# Patient Record
Sex: Female | Born: 1950 | Race: White | Hispanic: No | Marital: Married | Smoking: Never smoker
Health system: Southern US, Community
[De-identification: ages and names within clinical notes are randomized; demographics above are authoritative.]

## PROBLEM LIST (undated history)

## (undated) DIAGNOSIS — G20 Parkinson's disease: Secondary | ICD-10-CM

## (undated) DIAGNOSIS — K449 Diaphragmatic hernia without obstruction or gangrene: Secondary | ICD-10-CM

## (undated) DIAGNOSIS — K219 Gastro-esophageal reflux disease without esophagitis: Secondary | ICD-10-CM

## (undated) DIAGNOSIS — G20A1 Parkinson's disease without dyskinesia, without mention of fluctuations: Secondary | ICD-10-CM

## (undated) DIAGNOSIS — I499 Cardiac arrhythmia, unspecified: Secondary | ICD-10-CM

## (undated) DIAGNOSIS — E039 Hypothyroidism, unspecified: Secondary | ICD-10-CM

## (undated) DIAGNOSIS — I10 Essential (primary) hypertension: Secondary | ICD-10-CM

## (undated) HISTORY — PX: INFERIOR OBLIQUE MYECTOMY: SHX1814

---

## 1981-04-06 HISTORY — PX: VAGINAL HYSTERECTOMY: SUR661

## 1998-08-27 ENCOUNTER — Ambulatory Visit (HOSPITAL_COMMUNITY): Admission: RE | Admit: 1998-08-27 | Discharge: 1998-08-27 | Payer: Self-pay | Admitting: Family Medicine

## 2002-04-06 HISTORY — PX: OTHER SURGICAL HISTORY: SHX169

## 2003-04-11 ENCOUNTER — Other Ambulatory Visit: Admission: RE | Admit: 2003-04-11 | Discharge: 2003-04-11 | Payer: Self-pay | Admitting: Family Medicine

## 2004-05-21 ENCOUNTER — Encounter: Admission: RE | Admit: 2004-05-21 | Discharge: 2004-05-21 | Payer: Self-pay | Admitting: Gastroenterology

## 2004-06-09 ENCOUNTER — Encounter: Admission: RE | Admit: 2004-06-09 | Discharge: 2004-06-09 | Payer: Self-pay | Admitting: Gastroenterology

## 2004-06-24 ENCOUNTER — Encounter (INDEPENDENT_AMBULATORY_CARE_PROVIDER_SITE_OTHER): Payer: Self-pay | Admitting: *Deleted

## 2004-06-24 ENCOUNTER — Encounter: Admission: RE | Admit: 2004-06-24 | Discharge: 2004-06-24 | Payer: Self-pay | Admitting: Endocrinology

## 2004-06-24 ENCOUNTER — Other Ambulatory Visit: Admission: RE | Admit: 2004-06-24 | Discharge: 2004-06-24 | Payer: Self-pay | Admitting: Interventional Radiology

## 2004-08-20 ENCOUNTER — Encounter (INDEPENDENT_AMBULATORY_CARE_PROVIDER_SITE_OTHER): Payer: Self-pay | Admitting: Specialist

## 2004-08-20 ENCOUNTER — Ambulatory Visit (HOSPITAL_COMMUNITY): Admission: RE | Admit: 2004-08-20 | Discharge: 2004-08-21 | Payer: Self-pay | Admitting: General Surgery

## 2005-04-06 HISTORY — PX: TOTAL THYROIDECTOMY: SHX2547

## 2006-04-06 HISTORY — PX: INFERIOR OBLIQUE MYECTOMY: SHX1814

## 2007-07-18 ENCOUNTER — Encounter: Admission: RE | Admit: 2007-07-18 | Discharge: 2007-07-18 | Payer: Self-pay | Admitting: Interventional Cardiology

## 2007-07-21 ENCOUNTER — Inpatient Hospital Stay (HOSPITAL_BASED_OUTPATIENT_CLINIC_OR_DEPARTMENT_OTHER): Admission: RE | Admit: 2007-07-21 | Discharge: 2007-07-21 | Payer: Self-pay | Admitting: Interventional Cardiology

## 2009-01-09 ENCOUNTER — Ambulatory Visit: Payer: Self-pay | Admitting: Family Medicine

## 2009-01-09 DIAGNOSIS — I10 Essential (primary) hypertension: Secondary | ICD-10-CM | POA: Insufficient documentation

## 2009-01-09 DIAGNOSIS — L910 Hypertrophic scar: Secondary | ICD-10-CM

## 2009-01-09 DIAGNOSIS — E785 Hyperlipidemia, unspecified: Secondary | ICD-10-CM

## 2009-01-09 DIAGNOSIS — E039 Hypothyroidism, unspecified: Secondary | ICD-10-CM | POA: Insufficient documentation

## 2009-01-09 DIAGNOSIS — G20 Parkinson's disease: Secondary | ICD-10-CM

## 2009-01-10 LAB — CONVERTED CEMR LAB: TSH: 2.171 microintl units/mL (ref 0.350–4.500)

## 2009-01-18 ENCOUNTER — Ambulatory Visit: Payer: Self-pay | Admitting: Family Medicine

## 2009-03-14 ENCOUNTER — Encounter: Payer: Self-pay | Admitting: Family Medicine

## 2009-04-03 ENCOUNTER — Telehealth (INDEPENDENT_AMBULATORY_CARE_PROVIDER_SITE_OTHER): Payer: Self-pay | Admitting: *Deleted

## 2009-04-11 ENCOUNTER — Ambulatory Visit: Payer: Self-pay | Admitting: Family Medicine

## 2009-04-11 DIAGNOSIS — M2550 Pain in unspecified joint: Secondary | ICD-10-CM | POA: Insufficient documentation

## 2009-04-18 ENCOUNTER — Encounter: Payer: Self-pay | Admitting: Family Medicine

## 2009-04-19 LAB — CONVERTED CEMR LAB
HCT: 37.7 % (ref 36.0–46.0)
Hemoglobin: 12.4 g/dL (ref 12.0–15.0)
Iron: 85 ug/dL (ref 42–145)
Saturation Ratios: 19 % — ABNORMAL LOW (ref 20–55)
TIBC: 442 ug/dL (ref 250–470)
Vitamin B-12: 274 pg/mL (ref 211–911)
WBC: 7.1 10*3/uL (ref 4.0–10.5)

## 2009-04-25 ENCOUNTER — Encounter: Payer: Self-pay | Admitting: Family Medicine

## 2009-07-15 ENCOUNTER — Encounter: Admission: RE | Admit: 2009-07-15 | Discharge: 2009-07-15 | Payer: Self-pay | Admitting: Family Medicine

## 2009-08-21 ENCOUNTER — Ambulatory Visit: Payer: Self-pay | Admitting: Family Medicine

## 2009-08-21 DIAGNOSIS — E669 Obesity, unspecified: Secondary | ICD-10-CM

## 2009-10-02 ENCOUNTER — Encounter: Payer: Self-pay | Admitting: Family Medicine

## 2009-12-25 ENCOUNTER — Encounter: Payer: Self-pay | Admitting: Family Medicine

## 2009-12-27 ENCOUNTER — Ambulatory Visit: Payer: Self-pay | Admitting: Family Medicine

## 2010-03-25 ENCOUNTER — Ambulatory Visit: Payer: Self-pay | Admitting: Family Medicine

## 2010-03-25 DIAGNOSIS — M25519 Pain in unspecified shoulder: Secondary | ICD-10-CM

## 2010-04-06 HISTORY — PX: OTHER SURGICAL HISTORY: SHX169

## 2010-04-24 ENCOUNTER — Encounter: Payer: Self-pay | Admitting: Family Medicine

## 2010-05-08 NOTE — Consult Note (Signed)
Summary: [facility]  [facility]   Imported By: Lanelle Bal 10/17/2009 08:55:57  _____________________________________________________________________  External Attachment:    Type:   Image     Comment:   External Document

## 2010-05-08 NOTE — Assessment & Plan Note (Signed)
Summary: THYROID LEVEL IS HIGH/   Vital Signs:  Patient profile:   60 year old female Height:      63 inches Weight:      222 pounds Pulse rate:   104 / minute BP sitting:   114 / 69  (right arm) Cuff size:   regular  Vitals Entered By: Avon Gully CMA, Duncan Dull) (December 27, 2009 4:00 PM) CC: f/u thyroid   Primary Care Faiza Bansal:  Nani Gasser MD  CC:  f/u thyroid.  History of Present Illness: Had abnormal TSH at work. Had noticed more brittle hair. VOice has been raspy. has gained weight as well.  She has gained 6 lb since her last OV.   Current Medications (verified): 1)  Atenolol 25 Mg Tabs (Atenolol) .Marland Kitchen.. 1 Tab By Mouth Daily 2)  Levothyroxine Sodium 112 Mcg Tabs (Levothyroxine Sodium) .... Take 1 Tab By Mouth Once Daily 3)  Ropinirole Hcl 3 Mg Tabs (Ropinirole Hcl) .... Take 1 Tab By Mouth Three Times A Day 4)  Gemfibrozil 600 Mg Tabs (Gemfibrozil) .... Take 1 Tab By Mouth Two Times A Day 5)  Azilect 1 Mg Tabs (Rasagiline Mesylate) .... Take 1 Tab By Mouth Once Daily 6)  Pantoprazole Sodium 40 Mg Tbec (Pantoprazole Sodium) .... Take 1 Tab By Mouth Once Daily 7)  Lisinopril-Hydrochlorothiazide 20-12.5 Mg Tabs (Lisinopril-Hydrochlorothiazide) .... Take 1 Tab By Mouth Once Daily 8)  Magnesium 250mg  .... Take By Mouth Once Daily 9)  Asa 81mg  .... Take 1 By Mouth Daily 10)  Fish Oil 1000 Mg Caps (Omega-3 Fatty Acids) .... 3 Tabs By Mouth Daily .  Allergies (verified): No Known Drug Allergies  Comments:  Nurse/Medical Assistant: The patient's medications and allergies were reviewed with the patient and were updated in the Medication and Allergy Lists. Avon Gully CMA, Duncan Dull) (December 27, 2009 4:01 PM)  Past History:  Past Surgical History: Hysterectomy 1983 Thryoid surgery 08/2006 Myectomy because of a valve that wasn't closing - [facility] - Dr. Verdis Prime ([place])  Physical Exam  General:  Well-developed,well-nourished,in no acute distress;  alert,appropriate and cooperative throughout examination Head:  Normocephalic and atraumatic without obvious abnormalities. No apparent alopecia or balding. Neck:  No deformities, masses, or tenderness noted. No palpable thyroid. Scar well healed.  Lungs:  Normal respiratory effort, chest expands symmetrically. Lungs are clear to auscultation, no crackles or wheezes. Heart:  Normal rate and regular rhythm. S1 and S2 normal without gallop, murmur, click, rub or other extra sounds. Skin:  Hair appears brittle.  Psych:  Cognition and judgment appear intact. Alert and cooperative with normal attention span and concentration. No apparent delusions, illusions, hallucinations   Impression & Recommendations:  Problem # 1:  HYPOTHYROIDISM (ICD-244.9) Over due to recheck levels esp since she is symptomatic.  Her updated medication list for this problem includes:    Levothyroxine Sodium 125 Mcg Tabs (Levothyroxine sodium) .Marland Kitchen... Take 1 tablet by mouth once a day in the am.  Labs Reviewed: TSH: 2.171 (01/09/2009)     Complete Medication List: 1)  Atenolol 25 Mg Tabs (Atenolol) .Marland Kitchen.. 1 tab by mouth daily 2)  Levothyroxine Sodium 125 Mcg Tabs (Levothyroxine sodium) .... Take 1 tablet by mouth once a day in the am. 3)  Ropinirole Hcl 3 Mg Tabs (ropinirole Hcl)  .... Take 1 tab by mouth three times a day 4)  Gemfibrozil 600 Mg Tabs (Gemfibrozil) .... Take 1 tab by mouth two times a day 5)  Azilect 1 Mg Tabs (Rasagiline mesylate) .... Take  1 tab by mouth once daily 6)  Pantoprazole Sodium 40 Mg Tbec (Pantoprazole sodium) .... Take 1 tab by mouth once daily 7)  Lisinopril-hydrochlorothiazide 20-12.5 Mg Tabs (Lisinopril-hydrochlorothiazide) .... Take 1 tab by mouth once daily 8)  Magnesium 250mg   .... Take by mouth once daily 9)  Asa 81mg   .... Take 1 by mouth daily 10)  Fish Oil 1000 Mg Caps (Omega-3 fatty acids) .... 3 tabs by mouth daily .  Patient Instructions: 1)  Recheck thyroid TSH in 6 weeks.  2)   Encourage weight loss, exercise, and healthy diet.  Prescriptions: PANTOPRAZOLE SODIUM 40 MG TBEC (PANTOPRAZOLE SODIUM) Take 1 tab by mouth once daily  #90 x 3   Entered and Authorized by:   Nani Gasser MD   Signed by:   Nani Gasser MD on 12/27/2009   Method used:   Electronically to        [facility] (retail)       [address] [place], [place]  [address]       Ph: [phone]       Fax: [phone]   RxID:   [id number] ATENOLOL 25 MG TABS (ATENOLOL) 1 tab by mouth daily  #90 x 1   Entered and Authorized by:   Nani Gasser MD   Signed by:   Nani Gasser MD on 12/27/2009   Method used:   Electronically to        [facility] (retail)       [address] [place], [place]  [address]       Ph: [phone]       Fax: [phone]   RxID:   [id number] LEVOTHYROXINE SODIUM 125 MCG TABS (LEVOTHYROXINE SODIUM) Take 1 tablet by mouth once a day in the AM.  #30 x 0   Entered and Authorized by:   Nani Gasser MD   Signed by:   Nani Gasser MD on 12/27/2009   Method used:   Electronically to        [facility] (retail)       [address] [place], [place]  [address]       Ph: [phone]       Fax: [phone]   RxID:   [id number]   Appended Document: THYROID LEVEL IS HIGH/ Pt also brough in hcopy of her labwork done at work.  Her Chold and TG were high. She says this is dramatically better than it used to be. She is gemfibrozil. Asked her to ahve work check a direct LDL for her. Alos adjust her thyroid meds and told her to have them rechecked through work and send me the report.  September 25, 20111:30 PM Metheney MD, Santina Evans   Lipid Panel Test Date: 12/25/2009                        Value        Units        H/L   Reference  Cholesterol:          226          mg/dL              (517-616) HDL Cholesterol:      39           mg/dL              (  30-80) Triglyceride:         433           mg/dL         H    (16-109)

## 2010-05-08 NOTE — Assessment & Plan Note (Signed)
Summary: Shoulder Pain, Left   Vital Signs:  Patient profile:   60 year old female Height:      63 inches Weight:      228 pounds BMI:     40.53 O2 Sat:      97 % on Room air Temp:     98.2 degrees F oral Pulse rate:   79 / minute BP sitting:   126 / 59  (left arm) Cuff size:   regular  Vitals Entered By: Payton Spark CMA (March 25, 2010 3:52 PM)  O2 Flow:  Room air CC: L arm pain x 1.5 weeks. No known injury.   Primary Care Provider:  Nani Gasser MD  CC:  L arm pain x 1.5 weeks. No known injury.Marland Kitchen  History of Present Illness: L arm pain x 1.5 weeks. No known injury. Pain from her outer sholder to mid-upper arm. Worse if abducts her shoulder to 90 degrees. .  Painful to sleep on that side. BOthers her more at night.  No alleviating sxs. Using [place]. Took Aleve a couple of times - not sure helps.  she says she takes Aleve once a day in the morning every day for her general joint pain.  She does note that her left shorter pain is worse in the evening so it is possible that the Aleve in the morning is wearing off and  the dose is actually helping her pain.  Current Medications (verified): 1)  Atenolol 25 Mg Tabs (Atenolol) .Marland Kitchen.. 1 Tab By Mouth Daily 2)  Levothyroxine Sodium 125 Mcg Tabs (Levothyroxine Sodium) .... Take 1 Tablet By Mouth Once A Day in The Am. 3)  Ropinirole Hcl 3 Mg Tabs (Ropinirole Hcl) .... Take 1 Tab By Mouth Three Times A Day 4)  Gemfibrozil 600 Mg Tabs (Gemfibrozil) .... Take 1 Tab By Mouth Two Times A Day 5)  Azilect 1 Mg Tabs (Rasagiline Mesylate) .... Take 1 Tab By Mouth Once Daily 6)  Pantoprazole Sodium 40 Mg Tbec (Pantoprazole Sodium) .... Take 1 Tab By Mouth Once Daily 7)  Lisinopril-Hydrochlorothiazide 20-12.5 Mg Tabs (Lisinopril-Hydrochlorothiazide) .... Take 1 Tab By Mouth Once Daily 8)  Magnesium 250mg  .... Take By Mouth Once Daily 9)  Asa 81mg  .... Take 1 By Mouth Daily 10)  Fish Oil 1000 Mg Caps (Omega-3 Fatty Acids) .... 3 Tabs By Mouth  Daily .  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  Married to [employer] with 4 kids.  Never Smoked Alcohol use-yes Drug use-no Regular exercise-no  Physical Exam  General:  Well-developed,well-nourished,in no acute distress; alert,appropriate and cooperative throughout examination Msk:  Right shoulder with extension to about 90 degress . ABle to reach behind her back.  Painful with crossover test. Tender over the upper deltoid and along the edge of the acromium.  + empty can test on the right.     Impression & Recommendations:  Problem # 1:  SHOULDER PAIN (ICD-719.41)  I really think she has burisit based on her history but she did have a + empty can test.  Recommend trial fo steori injection.  if this significantly improves her symptoms she likely has bursitis.  If not then will refer to physical therapy for treatment of her trigger cuff syndrome.  Would also get an x-ray at that time if she has not improved. No xrays today as not trauma etc. If not improving then consider  xray of the left shoulder.  I recommend icing her shoulder.  I also recommend trial of Aleve twice a day for the next 4 to 5 days.  She's to stop this immediately if she has any stomach or GI irritation.  Orders: Joint Aspirate / Injection, Large ([address])  Complete Medication List: 1)  Atenolol 25 Mg Tabs (Atenolol) .Marland Kitchen.. 1 tab by mouth daily 2)  Levothyroxine Sodium 125 Mcg Tabs (Levothyroxine sodium) .... Take 1 tablet by mouth once a day in the am. 3)  Ropinirole Hcl 3 Mg Tabs (ropinirole Hcl)  .... Take 1 tab by mouth three times a day 4)  Gemfibrozil 600 Mg Tabs (Gemfibrozil) .... Take 1 tab by mouth two times a day 5)  Azilect 1 Mg Tabs (Rasagiline mesylate) .... Take 1 tab by mouth once daily 6)  Pantoprazole Sodium 40 Mg Tbec (Pantoprazole sodium) .... Take 1 tab by  mouth once daily 7)  Lisinopril-hydrochlorothiazide 20-12.5 Mg Tabs (Lisinopril-hydrochlorothiazide) .... Take 1 tab by mouth once daily 8)  Magnesium 250mg   .... Take by mouth once daily 9)  Asa 81mg   .... Take 1 by mouth daily 10)  Fish Oil 1000 Mg Caps (Omega-3 fatty acids) .... 3 tabs by mouth daily . Prescriptions: LISINOPRIL-HYDROCHLOROTHIAZIDE 20-12.5 MG TABS (LISINOPRIL-HYDROCHLOROTHIAZIDE) Take 1 tab by mouth once daily  #90 x 1   Entered and Authorized by:   Nani Gasser MD   Signed by:   Nani Gasser MD on 03/25/2010   Method used:   Electronically to        [facility] (retail)       [address] [place], [place]  [address]       Ph: [phone]       Fax: [phone]   RxID:   [id number] PANTOPRAZOLE SODIUM 40 MG TBEC (PANTOPRAZOLE SODIUM) Take 1 tab by mouth once daily  #90 x 1   Entered and Authorized by:   Nani Gasser MD   Signed by:   Nani Gasser MD on 03/25/2010   Method used:   Electronically to        [facility] ([facility])       [address] [place], [place]  [address]       Ph: [phone]       Fax: [phone]   RxID:   [id number] GEMFIBROZIL 600 MG TABS (GEMFIBROZIL) Take 1 tab by mouth two times a day  #90 x 1   Entered and Authorized by:   Nani Gasser MD   Signed by:   Nani Gasser MD on 03/25/2010   Method used:   Electronically to        [facility] (retail)       [address] [place], [place]  [address]       Ph: [phone]       Fax: [phone]   RxID:   [id number] LEVOTHYROXINE SODIUM 125 MCG TABS (LEVOTHYROXINE SODIUM) Take 1 tablet by mouth once a day in the AM.  #90 x 0   Entered and Authorized by:   Nani Gasser MD   Signed by:   Nani Gasser MD on 03/25/2010   Method used:   Electronically to        [facility] (retail)       [address]  [place], [place]  [address]       Ph: [phone]       Fax: [phone]    RxID:   [id number] ATENOLOL 25 MG TABS (ATENOLOL) 1 tab by mouth daily  #90 x 1   Entered and Authorized by:   Nani Gasser MD   Signed by:   Nani Gasser MD on 03/25/2010   Method used:   Electronically to        [facility] ([facility]ail)       [address] [place], [place]  [address]       Ph: [phone]       Fax: [phone]   RxID:   [id number]    Orders Added: 1)  Est. Patient Level IV [14431] 2)  Joint Aspirate / Injection, Large [206[address]]    Procedure Note  Injections: Indication: acute pain  Procedure # 1: joint injection    Region: left shoulder    Location: posterior approach    Technique: 20 g 1-1/2' needle    Medication: 40 mg depomedrol    Anesthesia: 1% lidocaine w/o epinephrine  Cleaned and prepped with: alcohol and betadine Wound dressing: bandaid Instructions: ice

## 2010-05-08 NOTE — Assessment & Plan Note (Signed)
Summary: FU Keloid, dry skin,  & Shingles Vax   Vital Signs:  Patient profile:   60 year old female Height:      63 inches Weight:      212 pounds Pulse rate:   43 / minute BP sitting:   112 / 58  (left arm) Cuff size:   regular  Vitals Entered By: Kathlene November (April 11, 2009 3:57 PM) CC: recheck right ear   Primary Care Provider:  Nani Gasser MD  CC:  recheck right ear.  History of Present Illness: recheck right ear.  Feels the keoid has shrunk a little and would like a second injection. She also needs refills on some of her medications.  Has had aching in the legs and more thin hair. Deines acutal hair loss.  Says her skin is more dry but this is common withe the winter time for her.   Current Medications (verified): 1)  Atenolol 100 Mg Tabs (Atenolol) .... Take 1 Tab By Mouth Once Daily 2)  Levothyroxine Sodium 112 Mcg Tabs (Levothyroxine Sodium) .... Take 1 Tab By Mouth Once Daily 3)  Ropinirole Hcl 3 Mg Tabs (Ropinirole Hcl) .... Take 1 Tab By Mouth Three Times A Day 4)  Gemfibrozil 600 Mg Tabs (Gemfibrozil) .... Take 1 Tab By Mouth Two Times A Day 5)  Azilect 1 Mg Tabs (Rasagiline Mesylate) .... Take 1 Tab By Mouth Once Daily 6)  Pantoprazole Sodium 40 Mg Tbec (Pantoprazole Sodium) .... Take 1 Tab By Mouth Once Daily 7)  Lisinopril-Hydrochlorothiazide 20-12.5 Mg Tabs (Lisinopril-Hydrochlorothiazide) .... Take 1 Tab By Mouth Once Daily 8)  Magnesium 250mg  .... Take By Mouth Once Daily 9)  Asa 81mg  .... Take 1 By Mouth Daily 10)  Fish Oil 1000 Mg Caps (Omega-3 Fatty Acids) .... 3 Tabs By Mouth Daily .  Allergies (verified): No Known Drug Allergies  Comments:  Nurse/Medical Assistant: The patient's medications and allergies were reviewed with the patient and were updated in the Medication and Allergy Lists. Kathlene November (April 11, 2009 3:59 PM)  Physical Exam  General:  Well-developed,well-nourished,in no acute distress; alert,appropriate and cooperative  throughout examination Head:  Normocephalic and atraumatic without obvious abnormalities. No apparent alopecia or balding. Skin:  Right ear with approx 1 cm keloid.    Impression & Recommendations:  Problem # 1:  ESSENTIAL HYPERTENSION, BENIGN (ICD-401.1) Looks great today. REfilled medications.  Her updated medication list for this problem includes:    Atenolol 100 Mg Tabs (Atenolol) .Marland Kitchen... Take 1 tab by mouth once daily    Lisinopril-hydrochlorothiazide 20-12.5 Mg Tabs (Lisinopril-hydrochlorothiazide) .Marland Kitchen... 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 Use the freeze spary for comfort.  0.5 cc of 40mg  kenalog and 0.5cc of lidocaine no epi mixed 1:1 and 0.75 cc injected into the keloid lesion after the area was cleaned with alcohol swab.   Orders: Kenalog 10mg  (up to 2 units) (J3301)  Problem # 3:  DRY SKIN (ICD-701.1) Will rule out anemia and deficiencies that might be causing her hair and skin changes. We recently checked her thyroid and it was well controlled.   Complete Medication List: 1)  Atenolol 100 Mg Tabs (Atenolol) .... Take 1 tab by mouth once daily 2)  Levothyroxine Sodium 112 Mcg Tabs (Levothyroxine sodium) .... Take 1 tab by mouth once daily 3)  Ropinirole Hcl 3 Mg Tabs (ropinirole Hcl)  .... Take 1 tab by mouth three times a day  4)  Gemfibrozil 600 Mg Tabs (Gemfibrozil) .... Take 1 tab by mouth two times a day 5)  Azilect 1 Mg Tabs (Rasagiline mesylate) .... Take 1 tab by mouth once daily 6)  Pantoprazole Sodium 40 Mg Tbec (Pantoprazole sodium) .... Take 1 tab by mouth once daily 7)  Lisinopril-hydrochlorothiazide 20-12.5 Mg Tabs (Lisinopril-hydrochlorothiazide) .... Take 1 tab by mouth once daily 8)  Magnesium 250mg   .... Take by mouth once daily 9)  Asa 81mg   .... Take 1 by mouth daily 10)  Fish Oil 1000 Mg Caps (Omega-3 fatty acids) .... 3 tabs by mouth daily .  Other Orders: Zoster (Shingles) Vaccine Live  [id number]) Admin 1st Vaccine ([address]454) T-CBC No Diff [id number]) T-Vitamin B12 [phone]) T-Vitamin D (25-Hydroxy) [phone]) T-Iron [phone]) T-Iron Binding Capacity (TIBC) ([address]) T-Folate ([address]347) T-Comprehensive Metabolic Panel ([address]75) Prescriptions: LISINOPRIL-HYDROCHLOROTHIAZIDE 20-12.5 MG TABS (LISINOPRIL-HYDROCHLOROTHIAZIDE) Take 1 tab by mouth once daily  #90 x 3   Entered and Authorized by:   Nani Gasser MD   Signed by:   Nani Gasser MD on 04/11/2009   Method used:   Electronically to        [facility] [address] [phone]* (retail)       [address]       [place], [place]  [address]       Ph: [phone] or [phone]       Fax: [phone]   RxID:   [id number] ATENOLOL 100 MG TABS (ATENOLOL) Take 1 tab by mouth once daily  #90 x 3   Entered and Authorized by:   Nani Gasser MD   Signed by:   Nani Gasser MD on 04/11/2009   Method used:   [facility]ronically to        [facility]  [address] [phone]* (retail)       [address]       [place], [place]  [address]       Ph: [phone] or [phone]       Fax: [phone]   RxID:   [id number] LEVOTHYROXINE SODIUM 112 MCG TABS (LEVOTHYROXINE SODIUM) Take 1 tab by mouth once daily  #90 x 1   Entered and Authorized by:   Nani Gasser MD   Signed by:   Nani Gasser MD on 04/11/2009   Method used:   [facility]        [facility]  [address] [phone]* (retail)       [address]       [place], [place]  [address]       Ph: [phone] or [phone]       Fax: [phone]   RxID:   [id number] PANTOPRAZOLE SODIUM 40 MG TBEC (PANTOPRAZOLE SODIUM) Take 1 tab by mouth once daily  #90 x 3   Entered and Authorized by:   Nani Gasser MD   Signed by:   Nani Gasser MD on 04/11/2009   Method used:   [facility]        [facility]  [address] [phone]* (retail)       [address]       [place], [place]  [address]       Ph: [phone] or [phone]        Fax: [phone]   RxID:   [id number] GEMFIBROZIL 600 MG TABS (GEMFIBROZIL) Take 1 tab by mouth two times a day  #90 x 3   Entered and Authorized by:   Nani Gasser MD   Signed by:   Nani Gasser  MD on 04/11/2009   Method used:   [facility] [phone]* (retail)       [address]       [place], [place]  [address]       Ph: [phone] or [phone]       Fax: [phone]   RxID:   [id number]    Immunizations Administered:  Zostavax # 1:    Vaccine Type: Zostavax    Site: right deltoid    Mfr: Merck    Dose: 0.75ml    Route: IM    Given by: Kathlene November    Exp. Date: 05/03/2010    Lot #: [id number]    VIS given: 01/16/05 given April 11, 2009.   Procedure Note  Injections:    Comment: Lidocaine [id number]  Exp-10/2011 kenalog  [id number]  Exp.06/2010

## 2010-05-08 NOTE — Letter (Signed)
Summary: [facility]  [facility]   Imported By: Lanelle Bal 05/10/2009 09:11:17  _____________________________________________________________________  External Attachment:    Type:   Image     Comment:   External Document

## 2010-05-08 NOTE — Assessment & Plan Note (Signed)
Summary: f/u keloid/ hoarseness/ wt   Vital Signs:  Patient profile:   60 year old female Height:      63 inches Weight:      216 pounds BMI:     38.40 O2 Sat:      97 % on Room air Pulse rate:   55 / minute BP sitting:   95 / 57  (left arm) Cuff size:   regular  Vitals Entered By: Payton Spark CMA (Aug 21, 2009 4:01 PM)  O2 Flow:  Room air CC: Would like keloid removed from R ear. Also would like to discuss weight loss.   Primary Care Provider:  Nani Gasser MD  CC:  Would like keloid removed from R ear. Also would like to discuss weight loss.Marland Kitchen  History of Present Illness: 60 yo WF presents for a keloid behind her R ear.  It has been there for several months after changing earing.  Had hx of keloid in the past, needed plastic surgery.  Dr Linford Arnold has tried intralesional triamcinolone twice and it shrunk some but is still dangling under the R ear lobe and bothters her.  Not painful.    She is concerned about wt gain and voice hoarseness.  Had a total thyroidectomy for noncancerous reasons w/ o neck radiation years ago.  Denies acid reflux symptoms or sore throat.  has dry mouth.  Admits to lack of exercise but isn't sure why she is gaining wt w/o changing her diet.  TSH has been normal.  Current Medications (verified): 1)  Atenolol 100 Mg Tabs (Atenolol) .... Take 1/2 Tab By Mouth Once Daily 2)  Levothyroxine Sodium 112 Mcg Tabs (Levothyroxine Sodium) .... Take 1 Tab By Mouth Once Daily 3)  Ropinirole Hcl 3 Mg Tabs (Ropinirole Hcl) .... Take 1 Tab By Mouth Three Times A Day 4)  Gemfibrozil 600 Mg Tabs (Gemfibrozil) .... Take 1 Tab By Mouth Two Times A Day 5)  Azilect 1 Mg Tabs (Rasagiline Mesylate) .... Take 1 Tab By Mouth Once Daily 6)  Pantoprazole Sodium 40 Mg Tbec (Pantoprazole Sodium) .... Take 1 Tab By Mouth Once Daily 7)  Lisinopril-Hydrochlorothiazide 20-12.5 Mg Tabs (Lisinopril-Hydrochlorothiazide) .... Take 1 Tab By Mouth Once Daily 8)  Magnesium 250mg  ....  Take By Mouth Once Daily 9)  Asa 81mg  .... Take 1 By Mouth Daily 10)  Fish Oil 1000 Mg Caps (Omega-3 Fatty Acids) .... 3 Tabs By Mouth Daily .  Allergies (verified): No Known Drug Allergies  Past History:  Past Medical History: Heat dz HTN Hi chol  Cardiology - Dr. Verdis Prime ([place]) obese  Past Surgical History: Reviewed history from 01/09/2009 and no changes required. Hysterectomy 1983 Thryoid durgery 08/2006 Myectomy because of a valve that wasn't closing - [facility]  Cardiology - Dr. Verdis Prime [facility])  Social History: Reviewed history from 01/09/2009 and no changes required. 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  Married to Avery Dennison with 4 kids.  Never Smoked Alcohol use-yes Drug use-no Regular exercise-no  Review of Systems      See HPI  Physical Exam  General:  alert, well-developed, well-nourished, and well-hydrated.  obese Head:  normocephalic and atraumatic.   Nose:  no nasal discharge.   Mouth:  good dentition and pharynx pink and moist.  slightly hoarse Neck:  supple and no masses.   Lungs:  Normal respiratory effort, chest expands symmetrically. Lungs are clear to auscultation, no crackles or wheezes. Heart:  Normal rate and regular rhythm. S1  and S2 normal without gallop, murmur, click, rub or other extra sounds. Extremities:  trace ankle edema bilat Skin:  teardrop shaped hypertrophic scar hanging under the R earlobe.   Cervical Nodes:  No lymphadenopathy noted Psych:  good eye contact, not anxious appearing, and not depressed appearing.     Impression & Recommendations:  Problem # 1:  KELOID SCAR (ICD-701.4) Hypertrophic scar vs keloid R ear, s/p 2 intralesional steroid injections which did shrink it a little bit.  Wants removed by plastic surgeon.  Referral made. Orders: Surgical Referral (Surgery)  Problem # 2:  HOARSENESS (EAV-409.81) Will refer to ENT.  Highest likelihood that this is coming from silent  reflux. Orders: ENT Referral (ENT)  Problem # 3:  HYPOTHYROIDISM (ICD-244.9) Last TSH normal range. Her updated medication list for this problem includes:    Levothyroxine Sodium 112 Mcg Tabs (Levothyroxine sodium) .Marland Kitchen... Take 1 tab by mouth once daily  Labs Reviewed: TSH: 2.171 (01/09/2009)     Problem # 4:  OBESITY, UNSPECIFIED (ICD-278.00) BMI 38 c/w class II obesity.  I did refer her to the mypyramid.gov website for a customized meal/ exercise plan. I'd like to see her back in 1 month to review her dietary log.  Problem # 5:  ESSENTIAL HYPERTENSION, BENIGN (ICD-401.1) BP a bit low.  I dropped her Atenolol dose from 50--> 25 mg/ day.  Has annual f/u with Dr Verdis Prime.  Will RTC in 1 month to recheck. Her updated medication list for this problem includes:    Atenolol 25 Mg Tabs (Atenolol) .Marland Kitchen... 1 tab by mouth daily    Lisinopril-hydrochlorothiazide 20-12.5 Mg Tabs (Lisinopril-hydrochlorothiazide) .Marland Kitchen... Take 1 tab by mouth once daily  BP today: 95/57 Prior BP: 112/58 (04/11/2009)  Complete Medication List: 1)  Atenolol 25 Mg Tabs (Atenolol) .Marland Kitchen.. 1 tab by mouth daily 2)  Levothyroxine Sodium 112 Mcg Tabs (Levothyroxine sodium) .... Take 1 tab by mouth once daily 3)  Ropinirole Hcl 3 Mg Tabs (ropinirole Hcl)  .... Take 1 tab by mouth three times a day 4)  Gemfibrozil 600 Mg Tabs (Gemfibrozil) .... Take 1 tab by mouth two times a day 5)  Azilect 1 Mg Tabs (Rasagiline mesylate) .... Take 1 tab by mouth once daily 6)  Pantoprazole Sodium 40 Mg Tbec (Pantoprazole sodium) .... Take 1 tab by mouth once daily 7)  Lisinopril-hydrochlorothiazide 20-12.5 Mg Tabs (Lisinopril-hydrochlorothiazide) .... Take 1 tab by mouth once daily 8)  Magnesium 250mg   .... Take by mouth once daily 9)  Asa 81mg   .... Take 1 by mouth daily 10)  Fish Oil 1000 Mg Caps (Omega-3 fatty acids) .... 3 tabs by mouth daily .  Patient Instructions: 1)  Referral placed for plastic surgeon and ENT. 2)  Start food  diary. 3)  Check out MyPyramid.gov for free weight loss tools. 4)  Aim for 1 hr of exercise most days/ wk. 5)  Return for f/u weight with food log in 3 wks. Prescriptions: ATENOLOL 25 MG TABS (ATENOLOL) 1 tab by mouth daily  #30 x 3   Entered and Authorized by:   Seymour Bars DO   Signed by:   Seymour Bars DO on 08/21/2009   Method used:   Electronically to        [facility] [phone]* (retail)       [address]       [place], [place]  [address]       Ph: [phone] or [phone]  Fax: [phone]   RxID:   [id number]

## 2010-05-28 NOTE — Letter (Signed)
[facility]   Imported By: Kassie Mends 05/22/2010 08:31:06  _____________________________________________________________________  External Attachment:    Type:   Image     Comment:   External Document

## 2010-06-04 ENCOUNTER — Other Ambulatory Visit: Payer: Self-pay | Admitting: Family Medicine

## 2010-06-04 ENCOUNTER — Ambulatory Visit (INDEPENDENT_AMBULATORY_CARE_PROVIDER_SITE_OTHER): Payer: BC Managed Care – PPO | Admitting: Family Medicine

## 2010-06-04 ENCOUNTER — Ambulatory Visit
Admission: RE | Admit: 2010-06-04 | Discharge: 2010-06-04 | Disposition: A | Discharge status: Home / Self Care | Payer: BC Managed Care – PPO | Source: Ambulatory Visit | Attending: Family Medicine | Admitting: Family Medicine

## 2010-06-04 ENCOUNTER — Encounter: Payer: Self-pay | Admitting: Family Medicine

## 2010-06-04 DIAGNOSIS — M25519 Pain in unspecified shoulder: Secondary | ICD-10-CM

## 2010-06-11 ENCOUNTER — Other Ambulatory Visit: Payer: Self-pay | Admitting: Family Medicine

## 2010-06-11 DIAGNOSIS — M25512 Pain in left shoulder: Secondary | ICD-10-CM

## 2010-06-12 NOTE — Assessment & Plan Note (Signed)
Summary: L RTC pain   Vital Signs:  Patient profile:   60 year old female Height:      63 inches Weight:      223.75 pounds BMI:     39.78 O2 Sat:      96 % on Room air Temp:     97.6 degrees F oral Pulse rate:   65 / minute Pulse rhythm:   regular Resp:     18 per minute BP sitting:   114 / 71  (right arm) Cuff size:   large  Vitals Entered By: Mervin Kung CMA Duncan Dull) (June 04, 2010 4:01 PM)  O2 Flow:  Room air CC: Pt states she is still having shooting pain in her upper left arm. Injection didn't seem to help. Is Patient Diabetic? No Pain Assessment Patient in pain? yes     Location: upper left arm Comments Pt states she will be having a ?cardioversion in the near future; not scheduled yet. Currently on Coumadin 5mg  1 tablet once daily except 1 1/2 on Fridays. All other med doses and directions are correct. Nicki Guadalajara Fergerson CMA Duncan Dull)  June 04, 2010 4:11 PM    Primary Care Provider:  Nani Gasser MD  CC:  Pt states she is still having shooting pain in her upper left arm. Injection didn't seem to help.Marland Kitchen  History of Present Illness: 60 yo WF presents for pain in the L arm and shoulder that started months ago.  Dr Linford Arnold did a cortisone injection in Dec but it did not help.  She is having aching mostly at night.  Has not had an xray.  On coumadin per her cardiologist so cannot take NSAIDs.  She is taking Tyelnol which is not helping.  No neck pain.  No trauma or previous surgery.  She cannot lay on her L arm at night.  It wakes her up at night   Allergies (verified): No Known Drug Allergies  Past History:  Past Medical History: Reviewed history from 08/21/2009 and no changes required. Heat dz HTN Hi chol  Cardiology - Dr. Verdis Prime ([place]) obese  Past Surgical History: Reviewed history from 12/27/2009 and no changes required. Hysterectomy 1983 Thryoid surgery 08/2006 Myectomy because of a valve that wasn't closing - [facility]  Cardiology - Dr. Verdis Prime [facility])  Social History: Reviewed history from 01/09/2009 and no changes required. 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  Married to [employer]enn[employer] with 4 kids.  Never Smoked Alcohol use-yes Drug use-no Regular exercise-no  Review of Systems      See HPI  Physical Exam  General:  alert, well-developed, well-nourished, and well-hydrated.  obese Msk:  no joint effusions.  full C spine ROM.  + empty can L.  tender with apprehension test and ext rotation.  tender with Hawkins sign Pulses:  2+ L radial and ulnar pulses Extremities:  no UE edema Neurologic:  grip + 5/5 bilat, full bicept and tricept strenght on the L Skin:  color normal.     Impression & Recommendations:  Problem # 1:  SHOULDER PAIN (ICD-719.41) L shoulder pain x  ~3 mos, did not respond to corticosteroid injection in Dec 2011.  Exam c/w either RTC tendonopathy vs partial tear.  Pain is worsening.  Will RX Vicodin since she is on coumadin and tylenol is not helping.  Xray today in preparation for the need for a MRI (probable).   Her updated medication list for this problem includes:  Hydrocodone-acetaminophen 5-325 Mg Tabs (Hydrocodone-acetaminophen) .Marland Kitchen... 1-2 tabs by mouth three times a day as needed severe pain  Orders: T-DG Shoulder*L* (73030)  Complete Medication List: 1)  Atenolol 25 Mg Tabs (Atenolol) .Marland Kitchen.. 1 tab by mouth daily 2)  Levothyroxine Sodium 125 Mcg Tabs (Levothyroxine sodium) .... Take 1 tablet by mouth once a day in the am. 3)  Ropinirole Hcl 3 Mg Tabs (ropinirole Hcl)  .... Take 1 tab by mouth three times a day 4)  Gemfibrozil 600 Mg Tabs (Gemfibrozil) .... Take 1 tab by mouth two times a day 5)  Azilect 1 Mg Tabs (Rasagiline mesylate) .... Take 1 tab by mouth once daily 6)  Pantoprazole Sodium 40 Mg Tbec (Pantoprazole sodium) .... Take 1 tab by mouth once daily 7)  Lisinopril-hydrochlorothiazide 20-12.5 Mg Tabs (Lisinopril-hydrochlorothiazide) .... Take 1 tab  by mouth once daily 8)  Magnesium 250mg   .... Take by mouth once daily 9)  Asa 81mg   .... Take 1 by mouth daily 10)  Fish Oil 1000 Mg Caps (Omega-3 fatty acids) .... 3 tabs by mouth daily . 11)  Coumadin 5 Mg Tabs (Warfarin sodium) .... Take 1 tablet by mouth once a day except 1 1/2 tablets on fridays. 12)  Hydrocodone-acetaminophen 5-325 Mg Tabs (Hydrocodone-acetaminophen) .Marland Kitchen.. 1-2 tabs by mouth three times a day as needed severe pain  Patient Instructions: 1)  Take Hydrocodone for severe pain; can alternate with tylenol for shoulder pain. 2)  Xray.  Wil call you w results. 3)  may need MRI if normal. Prescriptions: HYDROCODONE-ACETAMINOPHEN 5-325 MG TABS (HYDROCODONE-ACETAMINOPHEN) 1-2 tabs by mouth three times a day as needed severe pain  #75 x 0   Entered and Authorized by:   Seymour Bars DO   Signed by:   Seymour Bars DO on 06/04/2010   Method used:   Printed then faxed to ...       [facility] (retail)       [address] [place], [place]  [address]       Ph: [phone]       Fax: [phone]   RxID:   [id number]    Orders Added: 1)  T-DG Shoulder*L* [73030] 2)  Est. Patient Level III [84132]    Current Allergies (reviewed today): No known allergies

## 2010-06-14 ENCOUNTER — Inpatient Hospital Stay: Admission: RE | Admit: 2010-06-14 | Payer: BC Managed Care – PPO | Source: Ambulatory Visit

## 2010-06-16 ENCOUNTER — Other Ambulatory Visit: Payer: Self-pay | Admitting: Family Medicine

## 2010-06-16 DIAGNOSIS — R52 Pain, unspecified: Secondary | ICD-10-CM

## 2010-06-19 ENCOUNTER — Telehealth: Payer: Self-pay | Admitting: Family Medicine

## 2010-06-21 ENCOUNTER — Ambulatory Visit
Admission: RE | Admit: 2010-06-21 | Discharge: 2010-06-21 | Disposition: A | Discharge status: Home / Self Care | Payer: BC Managed Care – PPO | Source: Ambulatory Visit | Attending: Family Medicine | Admitting: Family Medicine

## 2010-06-21 DIAGNOSIS — R52 Pain, unspecified: Secondary | ICD-10-CM

## 2010-07-03 NOTE — Progress Notes (Signed)
Summary: needs a MRI prior Auth  Phone Note From Other Clinic   Caller: Receptionist Summary of Call: Neysa Bonito from [facility] imaging called and left a message on our VM wanting you to know that this patient has been scheduled her MRI for 06/21/10 and it needs a prior-auth. You can call Neysa Bonito with any question at [phone].Michaelle Copas  June 19, 2010 5:01 PM  Initial call taken by: Michaelle Copas,  June 19, 2010 5:02 PM  Follow-up for Phone Call        done already Follow-up by: Avon Gully CMA, Duncan Dull),  June 23, 2010 11:45 AM

## 2010-08-19 NOTE — Cardiovascular Report (Signed)
[name], [name] NO.:  [id number]   MEDICAL RECORD NO.:  [id number]          PATIENT TYPE:  OIB   LOCATION:  1962                         FACILITY:  [facility]   PHYSICIAN:  Lyn Records, M.D.   DATE OF BIRTH:  1951/02/22   DATE OF PROCEDURE:  07/21/2007  DATE OF DISCHARGE:                            CARDIAC CATHETERIZATION   INDICATION:  The patient has documented hypertrophic obstructive  cardiomyopathy and is to undergo septal myectomy by Dr. Silvestre Mesi in the  coming months.  This study is being done to define her coronary anatomy  prior to procedure/surgery.   PROCEDURE PERFORMED:  1. Left heart cath.  2. Selective coronary angio.  3. Left ventriculography.   DESCRIPTION:  After informed consent, a 4-French sheath was placed in  the right femoral artery using a modified Seldinger technique.  A 4-  [place] A2 multipurpose catheter was used for hemodynamic recordings,  left ventriculography by hand injection, and selective right coronary  angiography.  A 4-French #4 left Judkins catheter was used for left  coronary angiography.  The patient tolerated the procedure without  complications.   The patient received 2 mg of Versed and 50 mcg of fentanyl for conscious  sedation.  Manual compression was used for hemostasis.   RESULTS:  1. Hemodynamic data:      a.     Aortic pressure 100/52.      b.     Left ventricular pressure:  The LV pressure varied between       90-108 systolic over 10 mmHg.  2. Left ventriculography:  The left ventricle is normal in size.      There is vigorous contractility in mid-to-proximal cavity near      obliteration noted.  The mid-to-distal LV obliterates with      contractility.  EF is greater than 75%.  No obvious mitral      regurgitation is noted.  3. Coronary angiography:      a.     Left main coronary:  Normal.      b.     Left anterior descending coronary:  The left anterior       descending coronary artery is widely  patent and is normal.  LAD is       a large vessel, which gives origin to a large diagonal.  This       diagonal is preceded by a segmental 40%-50% narrowing in the LAD.       The LAD beyond the bifurcation with the diagonal contains moderate       diffuse disease and in some projections, there appears to be mild-       to-moderate systolic compression.  The dominant septal perforator       arises distal to the LAD diagonal bifurcation.  Two small septal       perforators arise near the bifurcation.  In an AP cranial view,       there appears to be mild systolic compression of the larger septal       perforator.  The diagonal is free of any significant obstruction.  The LAD is transapical.      c.     Circumflex artery:  The circumflex coronary artery is a       large vessel that gives origin to a large branching first obtuse       marginal, a moderate-sized second obtuse marginal, and a smaller       third obtuse marginal branch distally.  The circumflex system is       entirely normal and very large in caliber.      d.     Ramus intermedius branch:  A small-to-moderate sized ramus       branch arises from the distal left main and is free of       obstruction.  There is a suggestion that it may supply some septal       perforator territory.      e.     Right coronary:  The right coronary artery appears to be co-       dominant.  Moderate irregularities are noted in the mid vessel       obstructing the vessel up to 40%-50%.  No high-grade obstruction       is seen.   CONCLUSION:  1. Moderate atherosclerosis involving the proximal/mid left anterior      descending and mid right coronary artery.  No focal high-grade      obstructions are noted.  2. Suggestion of mild systolic compression of the mid-to-distal left      anterior descending and also the second septal perforator in      certain views.  3. Left ventricular contractility is vigorous.  We did not attempt to      measure  an intracavitary gradient or pullback gradient.   PLAN:  Further management per Dr. Silvestre Mesi at [facility] with reference to septal myectomy.  My impression is that the  patient's coronary artery disease should be managed with aggressive risk  factor modification.      Lyn Records, M.D.  Electronically Signed     HWS/MEDQ  D:  07/21/2007  T:  07/22/2007  Job:  [id number]   cc:   Nolon Rod, M.D.  Brion Aliment. Glower, M.D

## 2010-08-22 NOTE — Op Note (Signed)
[name], [name]              ACCOUNT NO.:  [id number]   MEDICAL RECORD NO.:  [id number]          PATIENT TYPE:  OIB   LOCATION:  2550                         FACILITY:  [facility]   PHYSICIAN:  Angelia Mould. Derrell Lolling, M.D.DATE OF BIRTH:  07/10/1950   DATE OF PROCEDURE:  08/20/2004  DATE OF DISCHARGE:                                 OPERATIVE REPORT   PREOPERATIVE DIAGNOSIS:  Nontoxic multinodular goiter.   POSTOPERATIVE DIAGNOSIS:  Nontoxic multinodular goiter.   OPERATION PERFORMED:  Total thyroidectomy.   SURGEON:  Angelia Mould. Derrell Lolling, M.D.   FIRST ASSISTANT:  Leonie Man, M.D.   OPERATIVE INDICATIONS:  This is a 60 year old white female with hypertrophic  obstructive cardiomyopathy and hypertension.  She has had a goiter for  several years.  For the past 2-3 years she been having progressive problems  with swallowing.  She has had a couple of episodes where food would not go  down and also has had trouble struggling to breathe when she lies on her  left side at night occasionally.  Her dysphagia is worse with solids than  with liquids.  She does not have any chest pain or abdominal pain.  On exam,  she has a large diffuse goiter that is quite noticeable and palpable.  Her  voice seems normal.  She has had a thyroid ultrasound which showed diffuse  enlargement of the thyroid gland with multiple nodules.  The largest nodule  was in the superior right lobe measuring 3.6 cm.  The patient has had a fine-  needle aspiration cytology of the nodule on the left side which showed  follicular cells and colloid but no signs of malignancy.  She has been  offered thyroidectomy because of her compressive symptoms.  She is brought  to the operating room electively.   OPERATIVE FINDINGS:  The patient had a huge goiter with huge bilateral lobes  and a somewhat enlarged pyramidal lobe.  The gland was soft.  There were no  hard nodules or signs of malignancy.  The tissues were quite friable, had  numerous feeding blood vessels and bled quite easily.  On the left side, we  clearly saw and preserved the parathyroid glands in the inferior position  but I was not sure that I saw the left superior parathyroid gland.  On the  right, I saw and preserved the superior parathyroid glands and the inferior  parathyroid glands.  We identified the area of the recurrent laryngeal nerve  on both sides.  On the left side, we had some bleeding near the nerve and we  actually had to dissect the nerve out to be sure of where it was and we did  that and then we were able to handle bleeding from a couple of small vessels  with small clips, but we felt like we preserved the recurrent laryngeal  nerves on both sides.  At the completion of the case there was absolutely no  bleeding and the incisions were perfectly dry.  There was no adenopathy.   OPERATIVE TECHNIQUE:  Following the induction of general endotracheal  anesthesia, the patient was placed  in reverse Trendelenburg position with  the neck extended.  The neck was prepped and draped in a sterile fashion. A  curved transverse incision was made about 2 cm above the suprasternal notch.  Dissection was carried down through the subcutaneous tissue and platysma  muscle.  Skin and platysma muscle flaps were raised superiorly and  inferiorly.  A self-retaining retractor was placed.  The strap muscles were  divided in the midline.  Some bleeding vessels which were quite large were  controlled with suture ligatures of 3-0 Vicryl.   We dissected out first the left lobe and then second the right lobe.  Dissection had to proceed slowly because of the large volume of the tissue,  but the tissues ultimately did dissect away from the underlying strap  muscles without any signs of any inflammation or fibrosis.  The lower pole  was mobilized, a few of vascular channels were controlled between metal  clips and divided.  Superior pole vessels on the left were isolated  and  sequentially isolated, ligated in continuity with 2-0 silk ties both above  and below and a metal clip placed above for security.  These vessels were  then ligated.  We were able to deliver the left lobe into the wound and we  very carefully dissected out the smaller vascular channels.  We did see the  inferior parathyroids on the left and they were preserved.  We did identify  the recurrent laryngeal nerve on the left and actually exposed it a bit to  control some bleeding from some veins nearby.  The small vascular channels  were controlled with fine metal clips.  We then mobilized the thyroid up  onto the trachea and mobilized the left lobe and the isthmus off of the  anterior tracheal wall.   I then turned my attention to the right lobe and in a like fashion mobilized  it out of its bed with blunt dissection.  Small venous channels from the  lower pole were divided between metal clips.  Superior pole vessels were  isolated and ligated in continuity with 2-0 silk ties and a metal clip  placed superiorly. We dissected the vascular channels from lateral to  medial.  We felt that we saw the recurrent laryngeal nerve on this side but  on the right side I did not dissect it out very much.  We clearly saw the  superior parathyroid gland on the right and most likely saw and preserved  the inferior parathyroid glands on the right.  Once we had mobilized the  thyroid gland up off of the trachea and away from the recurrent laryngeal  nerve, we dissected it up off the trachea and then dissected the pyramidal  lobe up and off. We marked the specimen with a suture to mark the right  superior pole and sent it to pathology for routine histology.   We copiously irrigated the wound.  We had had a moderate amount of bleeding  during the case but this  seemed to be all under control.  We placed  Surgicel gauze and observed the wound for about 10 minutes and it seemed to be completely dry at the  end.  The strap muscles were closed with  interrupted sutures of 3-0 Vicryl.  The platysma  muscle was closed with interrupted sutures of 3-0 Vicryl and the skin closed  with Steri-Strips and skin staples.  Clean bandages were placed and the  patient taken to the recovery room in stable condition.  Estimated blood  loss was about 100-125 cc.  Complications:  None.  Sponge, needle and  instrument counts were correct.      HMI/MEDQ  D:  08/20/2004  T:  08/20/2004  Job:  [id number]   cc:   Dorisann Frames, M.D.  [facility], [place] [address]  Fax: [phone]   Anna Genre. Little, M.D.  [address]  [place]  [place] [address]  Fax: [phone]   Lyn Records III, M.D.  [address]  [address]  [place]  [place] [address]  Fax: [phone]

## 2010-09-19 ENCOUNTER — Other Ambulatory Visit: Payer: Self-pay | Admitting: *Deleted

## 2010-09-19 MED ORDER — GEMFIBROZIL 600 MG PO TABS: 600.0000 mg | ORAL_TABLET | Freq: Two times a day (BID) | ORAL | Status: DC

## 2010-10-07 ENCOUNTER — Encounter: Payer: Self-pay | Admitting: Family Medicine

## 2010-10-07 DIAGNOSIS — I480 Paroxysmal atrial fibrillation: Secondary | ICD-10-CM | POA: Insufficient documentation

## 2010-10-13 ENCOUNTER — Encounter: Payer: Self-pay | Admitting: Family Medicine

## 2010-10-13 ENCOUNTER — Telehealth: Payer: Self-pay | Admitting: Family Medicine

## 2010-10-13 NOTE — Telephone Encounter (Signed)
Call pt : Due fpr appt to f/u her DM.

## 2010-10-14 NOTE — Telephone Encounter (Signed)
LMOM advising pt to call and sched appt

## 2010-10-22 ENCOUNTER — Encounter: Payer: Self-pay | Admitting: Family Medicine

## 2010-11-17 ENCOUNTER — Other Ambulatory Visit: Payer: Self-pay | Admitting: *Deleted

## 2010-11-21 ENCOUNTER — Other Ambulatory Visit: Payer: Self-pay | Admitting: *Deleted

## 2010-11-21 MED ORDER — ATENOLOL 25 MG PO TABS: 25.0000 mg | ORAL_TABLET | Freq: Every day | ORAL | Status: DC

## 2010-11-26 ENCOUNTER — Other Ambulatory Visit: Payer: Self-pay | Admitting: Family Medicine

## 2010-11-26 ENCOUNTER — Other Ambulatory Visit: Payer: Self-pay | Admitting: *Deleted

## 2010-11-26 MED ORDER — PANTOPRAZOLE SODIUM 40 MG PO TBEC: 40.0000 mg | DELAYED_RELEASE_TABLET | Freq: Every day | ORAL | Status: DC

## 2010-11-26 NOTE — Telephone Encounter (Signed)
Pt called and needed refill of her pantoprazole 40 mg.  Pharm she said has sent request 4 times. Plan:  Rx sent electronically #90/0 refills. Jarvis Newcomer, LPN Domingo Dimes

## 2010-11-27 ENCOUNTER — Other Ambulatory Visit: Payer: Self-pay | Admitting: Family Medicine

## 2010-12-31 ENCOUNTER — Telehealth: Payer: Self-pay | Admitting: *Deleted

## 2010-12-31 NOTE — Telephone Encounter (Signed)
Med list updated

## 2011-01-02 ENCOUNTER — Encounter: Payer: Self-pay | Admitting: Family Medicine

## 2011-01-02 ENCOUNTER — Ambulatory Visit (INDEPENDENT_AMBULATORY_CARE_PROVIDER_SITE_OTHER): Payer: BC Managed Care – PPO | Admitting: Family Medicine

## 2011-01-02 VITALS — BP 122/71 | HR 64 | Temp 98.0°F | Ht 63.0 in | Wt 224.0 lb

## 2011-01-02 DIAGNOSIS — R1110 Vomiting, unspecified: Secondary | ICD-10-CM

## 2011-01-02 DIAGNOSIS — Z1231 Encounter for screening mammogram for malignant neoplasm of breast: Secondary | ICD-10-CM

## 2011-01-02 DIAGNOSIS — L910 Hypertrophic scar: Secondary | ICD-10-CM

## 2011-01-02 DIAGNOSIS — E039 Hypothyroidism, unspecified: Secondary | ICD-10-CM

## 2011-01-02 DIAGNOSIS — Z23 Encounter for immunization: Secondary | ICD-10-CM

## 2011-01-02 DIAGNOSIS — R635 Abnormal weight gain: Secondary | ICD-10-CM

## 2011-01-02 MED ORDER — OMEGA-3-ACID ETHYL ESTERS 1 G PO CAPS: 2.0000 g | ORAL_CAPSULE | Freq: Two times a day (BID) | ORAL | Status: DC

## 2011-01-02 MED ORDER — LEVOTHYROXINE SODIUM 137 MCG PO TABS: 137.0000 ug | ORAL_TABLET | Freq: Every day | ORAL | Status: DC

## 2011-01-02 NOTE — Patient Instructions (Signed)
Recheck thyroid in 4 weeks and we can see if need to adjust your dose Lets recheck your sugar and cholesterol in 3 months.

## 2011-01-02 NOTE — Progress Notes (Signed)
Subjective:    Patient ID: [name], female    DOB: 1951/02/05, 60 y.o.   MRN: [id number]  HPI Vomiting started about 2 months ago. Most of the time dry heaves but sometimes vomits.  Tends to happen after eating.  No nausea or fullness.  No abdominal pain and cramping. Stopped her azlect but that did't help.  No bloating or heartburn. No constipation. Feels better after she vomits. ON dizziness.   Has been gaining weight. She has not been getting any regular exercise. Brought in her labs. Her TSH i s11. She takes her med ever day an hour before breakfast on an empty stomach.   She is also here to discuss her lab work that she brought in with her today. Her serum glucose was 106. Her uric acid was 8.0. Her total cholesterol was 239. Her triglycerides were 831.  HDL 29. Her LDL was not able to be calculated. Her TSH was 11.71.  For the keloid scar on her ear she would like to be referred to plastic surgery for further treatment. I believe she has some injections in the past.   Review of Systems     BP 122/71  Pulse 64  Temp(Src) 98 F (36.7 C) (Oral)  Ht 5\' 3"  (1.6 m)  Wt 224 lb (101.606 kg)  BMI 39.68 kg/m2  SpO2 96%    No Known Allergies  No past medical history on file.  Past Surgical History  Procedure Date  . Vaginal hysterectomy 1983    History   Social History  . Marital Status: Married    Spouse Name: N/A    Number of Children: N/A  . Years of Education: N/A   Occupational History  . Not on file.   Social History Main Topics  . Smoking status: Never Smoker   . Smokeless tobacco: Not on file  . Alcohol Use: Not on file  . Drug Use: Not on file  . Sexually Active: Not on file   Other Topics Concern  . Not on file   Social History Narrative  . No narrative on file    No family history on file.  Current outpatient prescriptions:atenolol (TENORMIN) 25 MG tablet, Take 1 tablet (25 mg total) by mouth daily., Disp: 30 tablet, Rfl: 1;  gemfibrozil  (LOPID) 600 MG tablet, Take 1 tablet (600 mg total) by mouth 2 (two) times daily before a meal., Disp: 60 tablet, Rfl: 2;  levothyroxine (SYNTHROID, LEVOTHROID) 137 MCG tablet, Take 1 tablet (137 mcg total) by mouth daily., Disp: 30 tablet, Rfl: 0 pantoprazole (PROTONIX) 40 MG tablet, Take 1 tablet (40 mg total) by mouth daily., Disp: 90 tablet, Rfl: 0;  rOPINIRole (REQUIP) 3 MG tablet, Take 3 mg by mouth 2 (two) times daily.  , Disp: , Rfl: ;  rotigotine (NEUPRO) 2 MG/24HR, Place 1 patch onto the skin daily.  , Disp: , Rfl: ;  omega-3 acid ethyl esters (LOVAZA) 1 G capsule, Take 2 capsules (2 g total) by mouth 2 (two) times daily., Disp: 120 capsule, Rfl: 6   Objective:   Physical Exam  Constitutional: She is oriented to person, place, and time. She appears well-developed and well-nourished.       She is obese.   HENT:  Head: Normocephalic and atraumatic.  Cardiovascular: Normal rate, regular rhythm and normal heart sounds.   Pulmonary/Chest: Effort normal and breath sounds normal.  Abdominal: Soft. Bowel sounds are normal. She exhibits no distension and no mass. There is no tenderness. There  is no rebound and no guarding.  Neurological: She is alert and oriented to person, place, and time.  Skin: Skin is warm and dry.  Psychiatric: She has a normal mood and affect. Her behavior is normal.          Assessment & Plan:  Hypothyroid- Recheck in 1 mo. Then adjust med as needed. Continue to work on diet and exercise  Abnormal glucose - Recheck in 3 months.  She is likely insulin resistant. Hopefully she can get some of the weight off after we regulate her thyroid this will help her glucose as well.  High TG - hereditary. Recheck in 3 mo on the lovaza. We discussed how this can lower her triglycerides potentially by about 40-50%. I gave her some samples today we will try to send this through to see how much it cost will be with her insurance. He will likely need to be prior  arthritis.  Vomiting-unclear etiology. This is strange to me that she would have dry heaving and vomiting unless randomly that it doesn't happen more after eating. They're not necessarily right after eating. It doesn't sound like reflux or a diverticulum. She is not having any discomfort or pain with it. She denies any actual sensation of nausea or abdominal pain. I would like her to see GI for further evaluation. I don't that this could be related somehow to her Parkinson's she is a retried medication modification to see if that could have been part of it. She did not notice any change.  Keloid scar on her ear-she would like to go ahead and get a referral to plastic surgery for removal.  We will go ahead and schedule her for screening mammogram as well.

## 2011-01-06 ENCOUNTER — Other Ambulatory Visit: Payer: Self-pay | Admitting: *Deleted

## 2011-01-06 MED ORDER — ATENOLOL 25 MG PO TABS: 25.0000 mg | ORAL_TABLET | Freq: Every day | ORAL | Status: DC

## 2011-01-20 ENCOUNTER — Ambulatory Visit
Admission: RE | Admit: 2011-01-20 | Discharge: 2011-01-20 | Disposition: A | Discharge status: Home / Self Care | Payer: BC Managed Care – PPO | Source: Ambulatory Visit | Attending: Family Medicine | Admitting: Family Medicine

## 2011-01-20 DIAGNOSIS — Z1231 Encounter for screening mammogram for malignant neoplasm of breast: Secondary | ICD-10-CM

## 2011-01-21 IMAGING — MG MM DIGITAL SCREENING BILAT W/ CAD
4 series · 4 of 4 positions shown · non-contrast
Comparison: none

DG SCREEN MAMMOGRAM BILATERAL
Bilateral CC and MLO view(s) were taken.

DIGITAL SCREENING MAMMOGRAM WITH CAD:
There are scattered fibroglandular densities.  No masses or malignant type calcifications are 
identified.  Compared with prior studies.
Images were processed with CAD.

[R CC]
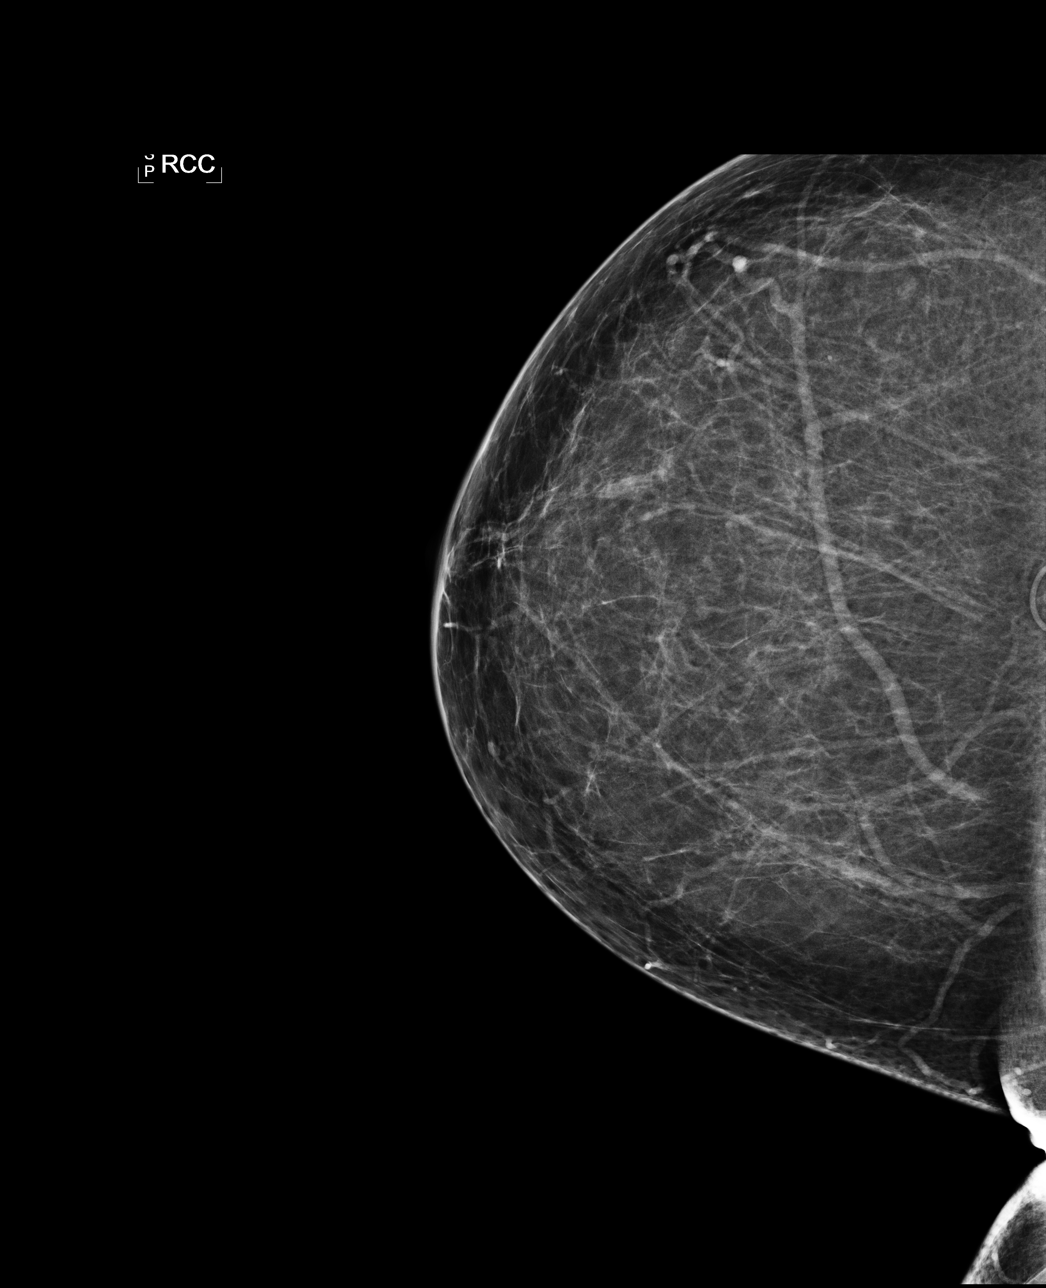

[L CC]
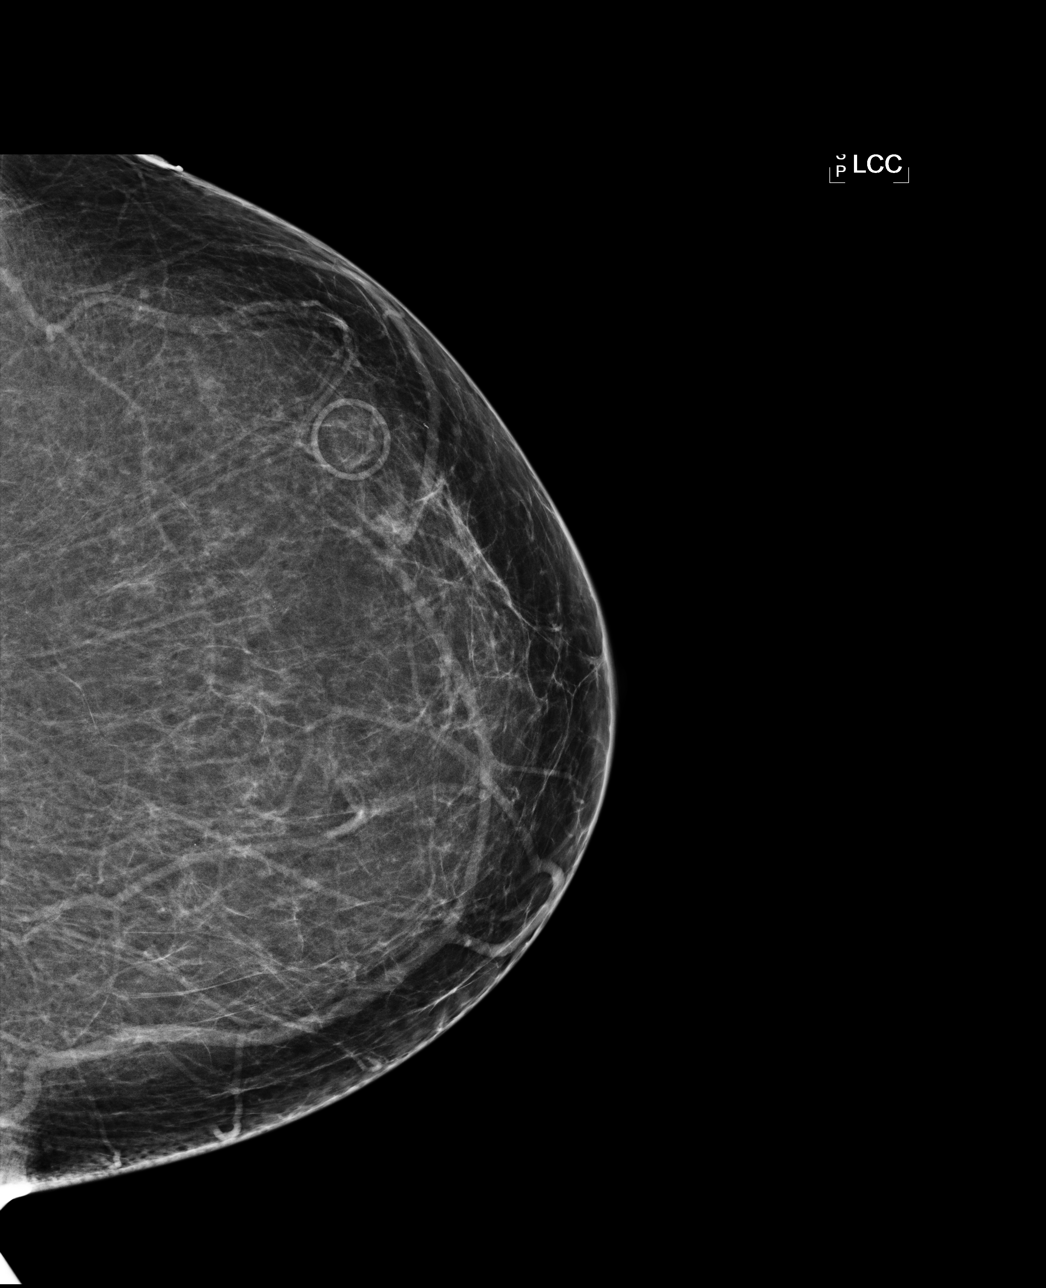

[L MLO]
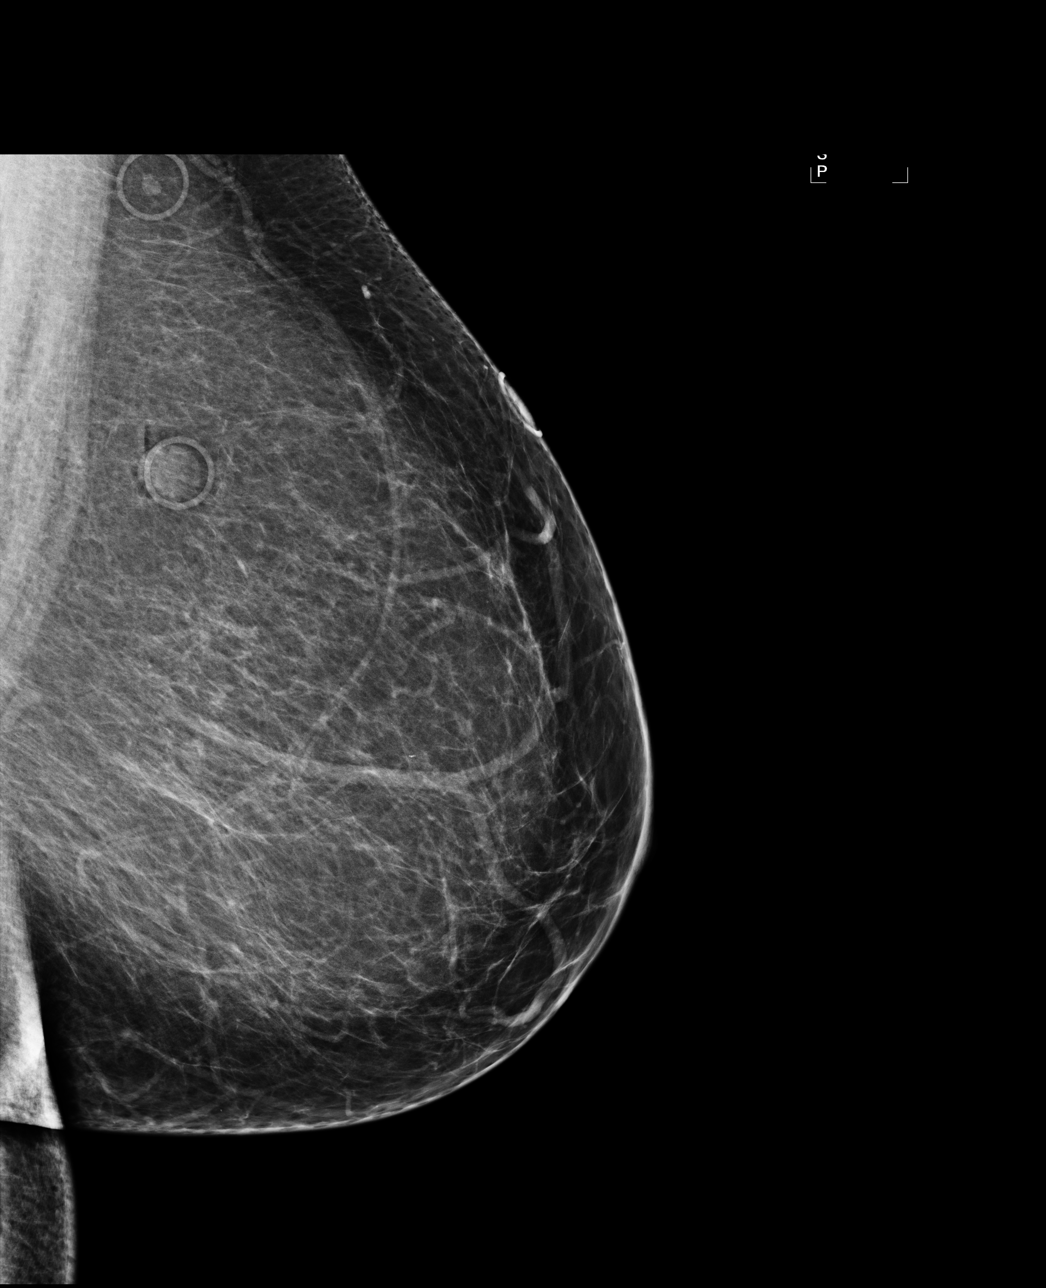

[R MLO]
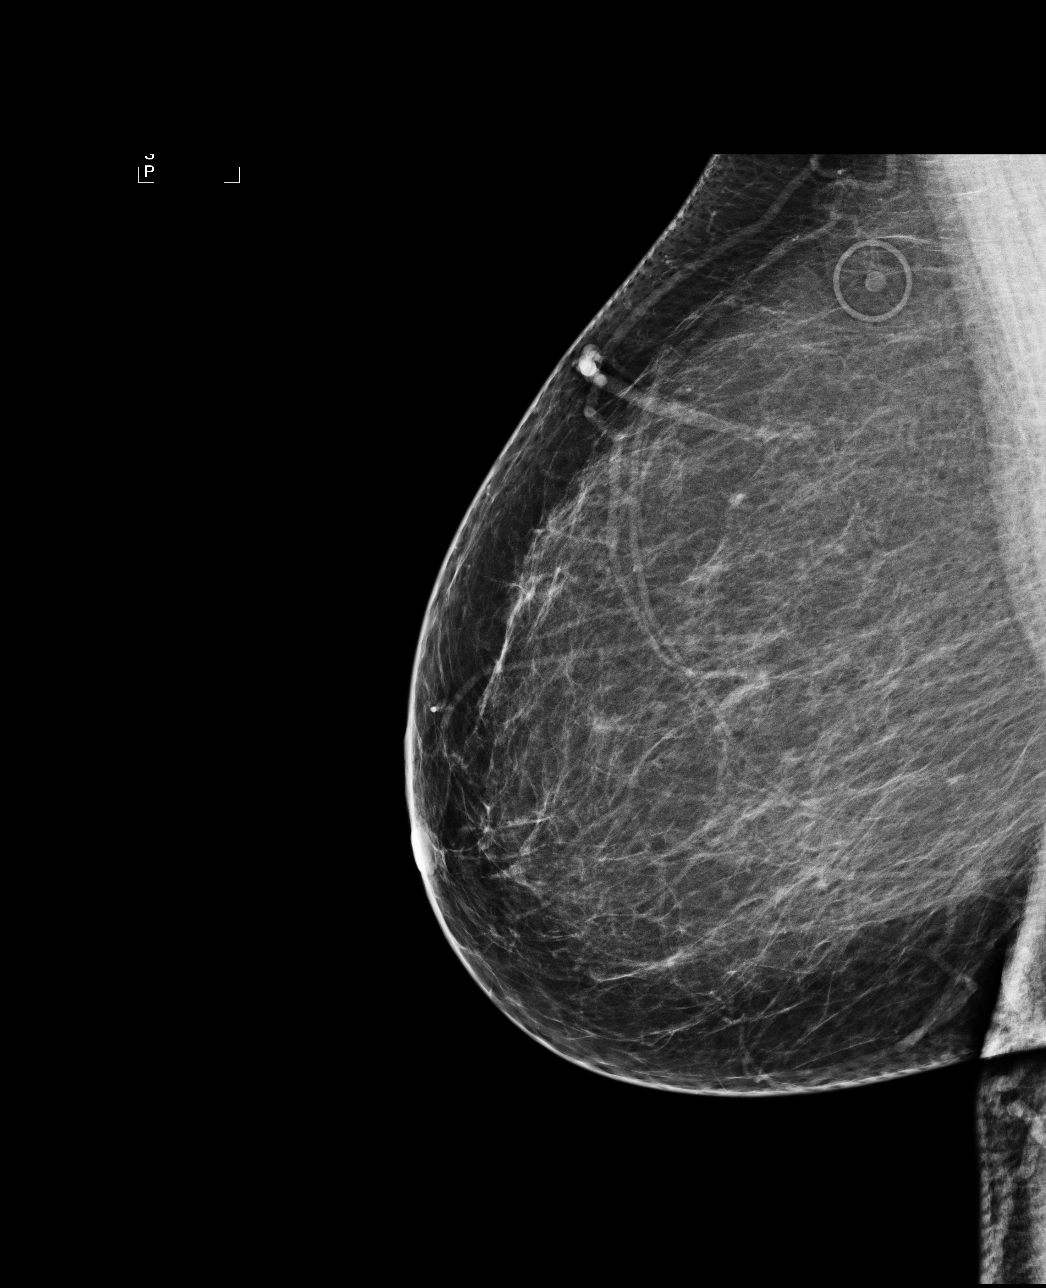

[4 of 4 positions shown; findings below may reference images not displayed]

IMPRESSION: No specific mammographic evidence of malignancy.  Next screening mammogram is recommended in one 
year.

A result letter of this screening mammogram will be mailed directly to the patient.

ASSESSMENT: Negative - BI-RADS 1

Screening mammogram in 1 year.
,

## 2011-01-22 ENCOUNTER — Encounter: Payer: Self-pay | Admitting: Family Medicine

## 2011-01-27 ENCOUNTER — Telehealth: Payer: Self-pay | Admitting: *Deleted

## 2011-02-04 ENCOUNTER — Telehealth: Payer: Self-pay | Admitting: Family Medicine

## 2011-02-04 ENCOUNTER — Encounter (HOSPITAL_BASED_OUTPATIENT_CLINIC_OR_DEPARTMENT_OTHER): Payer: Self-pay | Admitting: *Deleted

## 2011-02-04 NOTE — Telephone Encounter (Signed)
Tammy called from [facility] and said they had mutual pt doing surgery and needed any cardiology records we may have and prefer an EKG. Plan:  Sent the only record card wise which was transtelephonic arrhythmia monitoring summary report from [facility] Card office. Did send message when record sent for them to call Dr. Molly Maduro at [facility] and gave the [facility] office #. Jarvis Newcomer, LPN Domingo Dimes

## 2011-02-06 ENCOUNTER — Inpatient Hospital Stay (HOSPITAL_BASED_OUTPATIENT_CLINIC_OR_DEPARTMENT_OTHER)
Admission: RE | Admit: 2011-02-06 | Discharge: 2011-02-06 | Disposition: A | Discharge status: Home / Self Care | Payer: BC Managed Care – PPO | Source: Ambulatory Visit

## 2011-02-06 ENCOUNTER — Other Ambulatory Visit: Payer: Self-pay

## 2011-02-06 LAB — BASIC METABOLIC PANEL
BUN: 14 mg/dL (ref 6–23)
CO2: 25 mEq/L (ref 19–32)
Calcium: 9.8 mg/dL (ref 8.4–10.5)
Creatinine, Ser: 0.79 mg/dL (ref 0.50–1.10)
GFR calc non Af Amer: 87 mL/min — ABNORMAL LOW (ref >90)
Glucose, Bld: 123 mg/dL — ABNORMAL HIGH (ref 70–99)
Sodium: 139 mEq/L (ref 135–145)

## 2011-02-10 ENCOUNTER — Encounter (HOSPITAL_BASED_OUTPATIENT_CLINIC_OR_DEPARTMENT_OTHER): Payer: Self-pay | Admitting: Anesthesiology

## 2011-02-10 ENCOUNTER — Ambulatory Visit (HOSPITAL_BASED_OUTPATIENT_CLINIC_OR_DEPARTMENT_OTHER)
Admission: RE | Admit: 2011-02-10 | Discharge: 2011-02-10 | Disposition: A | Discharge status: Home / Self Care | Payer: BC Managed Care – PPO | Source: Ambulatory Visit | Attending: Plastic Surgery | Admitting: Plastic Surgery

## 2011-02-10 ENCOUNTER — Ambulatory Visit (HOSPITAL_BASED_OUTPATIENT_CLINIC_OR_DEPARTMENT_OTHER): Payer: BC Managed Care – PPO | Admitting: Anesthesiology

## 2011-02-10 ENCOUNTER — Encounter (HOSPITAL_BASED_OUTPATIENT_CLINIC_OR_DEPARTMENT_OTHER): Payer: Self-pay | Admitting: *Deleted

## 2011-02-10 ENCOUNTER — Encounter (HOSPITAL_BASED_OUTPATIENT_CLINIC_OR_DEPARTMENT_OTHER)
Admission: RE | Disposition: A | Discharge status: Home / Self Care | Payer: Self-pay | Source: Ambulatory Visit | Attending: Plastic Surgery

## 2011-02-10 ENCOUNTER — Other Ambulatory Visit: Payer: Self-pay | Admitting: Plastic Surgery

## 2011-02-10 DIAGNOSIS — Z01812 Encounter for preprocedural laboratory examination: Secondary | ICD-10-CM | POA: Insufficient documentation

## 2011-02-10 DIAGNOSIS — I10 Essential (primary) hypertension: Secondary | ICD-10-CM | POA: Insufficient documentation

## 2011-02-10 DIAGNOSIS — G20A1 Parkinson's disease without dyskinesia, without mention of fluctuations: Secondary | ICD-10-CM | POA: Insufficient documentation

## 2011-02-10 DIAGNOSIS — K449 Diaphragmatic hernia without obstruction or gangrene: Secondary | ICD-10-CM | POA: Insufficient documentation

## 2011-02-10 DIAGNOSIS — G20 Parkinson's disease: Secondary | ICD-10-CM | POA: Insufficient documentation

## 2011-02-10 DIAGNOSIS — K219 Gastro-esophageal reflux disease without esophagitis: Secondary | ICD-10-CM | POA: Insufficient documentation

## 2011-02-10 DIAGNOSIS — E119 Type 2 diabetes mellitus without complications: Secondary | ICD-10-CM | POA: Insufficient documentation

## 2011-02-10 DIAGNOSIS — Z01810 Encounter for preprocedural cardiovascular examination: Secondary | ICD-10-CM | POA: Insufficient documentation

## 2011-02-10 DIAGNOSIS — L910 Hypertrophic scar: Secondary | ICD-10-CM | POA: Diagnosis present

## 2011-02-10 DIAGNOSIS — E039 Hypothyroidism, unspecified: Secondary | ICD-10-CM | POA: Insufficient documentation

## 2011-02-10 HISTORY — DX: Essential (primary) hypertension: I10

## 2011-02-10 HISTORY — DX: Hypothyroidism, unspecified: E03.9

## 2011-02-10 HISTORY — PX: MASS EXCISION: SHX2000

## 2011-02-10 HISTORY — DX: Diaphragmatic hernia without obstruction or gangrene: K44.9

## 2011-02-10 HISTORY — DX: Cardiac arrhythmia, unspecified: I49.9

## 2011-02-10 HISTORY — DX: Gastro-esophageal reflux disease without esophagitis: K21.9

## 2011-02-10 SURGERY — EXCISION MASS
Anesthesia: Monitor Anesthesia Care

## 2011-02-10 MED ORDER — PROPOFOL 10 MG/ML IV EMUL
INTRAVENOUS | Status: DC | PRN
  Administered 2011-02-10: 100 ug/kg/min via INTRAVENOUS

## 2011-02-10 MED ORDER — ONDANSETRON HCL 4 MG/2ML IJ SOLN
INTRAMUSCULAR | Status: DC | PRN
  Administered 2011-02-10: 4 mg via INTRAVENOUS

## 2011-02-10 MED ORDER — LACTATED RINGERS IV SOLN
INTRAVENOUS | Status: DC | PRN
  Administered 2011-02-10 (×2): via INTRAVENOUS

## 2011-02-10 MED ORDER — LIDOCAINE-EPINEPHRINE 1 %-1:100000 IJ SOLN
INTRAMUSCULAR | Status: DC | PRN
  Administered 2011-02-10: 20 mL

## 2011-02-10 MED ORDER — MEPERIDINE HCL 25 MG/ML IJ SOLN: 6.2500 mg | INTRAMUSCULAR | Status: DC | PRN

## 2011-02-10 MED ORDER — CEFAZOLIN SODIUM 1-5 GM-% IV SOLN: 1.0000 g | INTRAVENOUS | Status: DC

## 2011-02-10 MED ORDER — BUPIVACAINE-EPINEPHRINE PF 0.5-1:200000 % IJ SOLN
INTRAMUSCULAR | Status: DC | PRN
  Administered 2011-02-10: 30 mL

## 2011-02-10 MED ORDER — CEFAZOLIN SODIUM-DEXTROSE 2-3 GM-% IV SOLR
2.0000 g | INTRAVENOUS | Status: AC
  Administered 2011-02-10: 2 g via INTRAVENOUS

## 2011-02-10 MED ORDER — LACTATED RINGERS IV SOLN
INTRAVENOUS | Status: DC
  Administered 2011-02-10: 11:00:00 via INTRAVENOUS

## 2011-02-10 MED ORDER — ONDANSETRON HCL 4 MG/2ML IJ SOLN: 4.0000 mg | Freq: Once | INTRAMUSCULAR | Status: DC | PRN

## 2011-02-10 MED ORDER — BACITRACIN ZINC 500 UNIT/GM EX OINT
TOPICAL_OINTMENT | CUTANEOUS | Status: DC | PRN
  Administered 2011-02-10: 1 via TOPICAL

## 2011-02-10 MED ORDER — MORPHINE SULFATE 2 MG/ML IJ SOLN
0.0500 mg/kg | INTRAMUSCULAR | Status: DC | PRN
  Administered 2011-02-10: 2 mg via INTRAVENOUS

## 2011-02-10 MED ORDER — HYDROMORPHONE HCL PF 1 MG/ML IJ SOLN: 0.2500 mg | INTRAMUSCULAR | Status: DC | PRN

## 2011-02-10 MED ORDER — MIDAZOLAM HCL 5 MG/5ML IJ SOLN
INTRAMUSCULAR | Status: DC | PRN
  Administered 2011-02-10 (×2): 1 mg via INTRAVENOUS

## 2011-02-10 MED ORDER — FENTANYL CITRATE 0.05 MG/ML IJ SOLN
INTRAMUSCULAR | Status: DC | PRN
  Administered 2011-02-10 (×5): 25 ug via INTRAVENOUS
  Administered 2011-02-10: 50 ug via INTRAVENOUS
  Administered 2011-02-10: 25 ug via INTRAVENOUS

## 2011-02-10 SURGICAL SUPPLY — 54 items
APL SKNCLS STERI-STRIP NONHPOA (GAUZE/BANDAGES/DRESSINGS) ×1
BALL CTTN LRG ABS STRL LF (GAUZE/BANDAGES/DRESSINGS)
BANDAGE ELASTIC 6 VELCRO ST LF (GAUZE/BANDAGES/DRESSINGS) IMPLANT
BENZOIN TINCTURE PRP APPL 2/3 (GAUZE/BANDAGES/DRESSINGS) ×1 IMPLANT
BLADE HEX COATED 2.75 (ELECTRODE) ×2 IMPLANT
BLADE SURG 10 STRL SS (BLADE) IMPLANT
BLADE SURG 15 STRL LF DISP TIS (BLADE) ×1 IMPLANT
BLADE SURG 15 STRL SS (BLADE) ×2
CANISTER SUCTION 1200CC (MISCELLANEOUS) IMPLANT
CLOSURE STERI STRIP 1/2 X4 (GAUZE/BANDAGES/DRESSINGS) ×1 IMPLANT
COTTONBALL LRG STERILE PKG (GAUZE/BANDAGES/DRESSINGS) IMPLANT
COVER MAYO STAND STRL (DRAPES) ×2 IMPLANT
COVER TABLE BACK 60X90 (DRAPES) ×2 IMPLANT
DECANTER SPIKE VIAL GLASS SM (MISCELLANEOUS) ×2 IMPLANT
DRAPE PED LAPAROTOMY (DRAPES) IMPLANT
DRAPE U-SHAPE 76X120 STRL (DRAPES) IMPLANT
ELECT NDL TIP 2.8 STRL (NEEDLE) IMPLANT
ELECT NEEDLE TIP 2.8 STRL (NEEDLE) IMPLANT
ELECT REM PT RETURN 9FT ADLT (ELECTROSURGICAL) ×2
ELECTRODE REM PT RTRN 9FT ADLT (ELECTROSURGICAL) ×1 IMPLANT
GAUZE SPONGE 4X4 12PLY STRL LF (GAUZE/BANDAGES/DRESSINGS) IMPLANT
GAUZE SPONGE 4X4 16PLY XRAY LF (GAUZE/BANDAGES/DRESSINGS) IMPLANT
GLOVE BIO SURGEON STRL SZ 6.5 (GLOVE) ×2 IMPLANT
GLOVE ECLIPSE 7.0 STRL STRAW (GLOVE) ×1 IMPLANT
GOWN PREVENTION PLUS XLARGE (GOWN DISPOSABLE) ×3 IMPLANT
NDL HYPO 25X1 1.5 SAFETY (NEEDLE) IMPLANT
NDL HYPO 30X.5 LL (NEEDLE) IMPLANT
NDL SPNL 18GX3.5 QUINCKE PK (NEEDLE) IMPLANT
NEEDLE HYPO 25X1 1.5 SAFETY (NEEDLE) ×2 IMPLANT
NEEDLE HYPO 30X.5 LL (NEEDLE) IMPLANT
NEEDLE SPNL 18GX3.5 QUINCKE PK (NEEDLE) IMPLANT
NS IRRIG 1000ML POUR BTL (IV SOLUTION) IMPLANT
PACK BASIN DAY SURGERY FS (CUSTOM PROCEDURE TRAY) ×2 IMPLANT
PAD ALCOHOL SWAB (MISCELLANEOUS) IMPLANT
PEN SKIN MARKING BROAD TIP (MISCELLANEOUS) IMPLANT
PENCIL BUTTON HOLSTER BLD 10FT (ELECTRODE) ×2 IMPLANT
STRIP CLOSURE SKIN 1/2X4 (GAUZE/BANDAGES/DRESSINGS) ×1 IMPLANT
SUT MNCRL AB 3-0 PS2 18 (SUTURE) ×2 IMPLANT
SUT MNCRL AB 4-0 PS2 18 (SUTURE) IMPLANT
SUT MON AB 2-0 CT1 36 (SUTURE) ×1 IMPLANT
SUT MON AB 4-0 PC3 18 (SUTURE) IMPLANT
SUT MON AB 5-0 PS2 18 (SUTURE) IMPLANT
SUT PROLENE 4 0 P 3 18 (SUTURE) ×1 IMPLANT
SUT PROLENE 5 0 PS 2 (SUTURE) IMPLANT
SUT PROLENE 6 0 P 1 18 (SUTURE) IMPLANT
SUT SILK 4 0 PS 2 (SUTURE) ×1 IMPLANT
SUT VIC AB 5-0 P-3 18X BRD (SUTURE) IMPLANT
SUT VIC AB 5-0 P3 18 (SUTURE)
SYR BULB IRRIGATION 50ML (SYRINGE) IMPLANT
SYR CONTROL 10ML LL (SYRINGE) ×2 IMPLANT
TOWEL OR 17X24 6PK STRL BLUE (TOWEL DISPOSABLE) ×4 IMPLANT
TUBE CONNECTING 20X1/4 (TUBING) IMPLANT
WATER STERILE IRR 1000ML POUR (IV SOLUTION) ×1 IMPLANT
YANKAUER SUCT BULB TIP NO VENT (SUCTIONS) IMPLANT

## 2011-02-10 NOTE — Transfer of Care (Signed)
Immediate Anesthesia Transfer of Care Note  Patient: [name]  Procedure(s) Performed:  EXCISION MASS - excision keloid sternum and right ear  Patient Location: PACU  Anesthesia Type: MAC  Level of Consciousness: awake and alert   Airway & Oxygen Therapy: Patient Spontanous Breathing and Patient connected to nasal cannula oxygen  Post-op Assessment: Report given to PACU RN and Post -op Vital signs reviewed and stable  Post vital signs: Reviewed and stable  Complications: No apparent anesthesia complications 

## 2011-02-10 NOTE — Transfer of Care (Signed)
Immediate Anesthesia Transfer of Care Note  Patient: [name]  Procedure(s) Performed:  EXCISION MASS - excision keloid sternum and right ear  Patient Location: PACU  Anesthesia Type: MAC  Level of Consciousness: awake and alert   Airway & Oxygen Therapy: Patient Spontanous Breathing and Patient connected to nasal cannula oxygen  Post-op Assessment: Report given to PACU RN and Post -op Vital signs reviewed and stable  Post vital signs: Reviewed and stable  Complications: No apparent anesthesia complications

## 2011-02-10 NOTE — Anesthesia Preprocedure Evaluation (Addendum)
Anesthesia Evaluation  Patient identified by MRN, date of birth, ID band Patient awake    Reviewed: Allergy & Precautions, H&P , NPO status , Patient's Chart, lab work & pertinent test results, reviewed documented beta blocker date and time   Airway Mallampati: I TM Distance: >3 FB Neck ROM: Full    Dental  (+) Teeth Intact and Dental Advisory Given   Pulmonary neg pulmonary ROS,    Pulmonary exam normal       Cardiovascular hypertension, Pt. on home beta blockers and Pt. on medications     Neuro/Psych  Neuromuscular disease (Hx of Parkinson's Disease) Negative Psych ROS   GI/Hepatic Neg liver ROS, hiatal hernia, GERD-  Medicated and Controlled,  Endo/Other  Diabetes mellitus-, Well Controlled, Type 2, Oral Hypoglycemic AgentsHypothyroidism Lab value of glu 123 4 days ago. Repeated today, fasting of 91. Pt told she is unlikely to be diabetic at present.  Renal/GU negative Renal ROS     Musculoskeletal negative musculoskeletal ROS (+)   Abdominal   Peds  Hematology negative hematology ROS (+)   Anesthesia Other Findings   Reproductive/Obstetrics negative OB ROS                         Anesthesia Physical Anesthesia Plan  ASA: III  Anesthesia Plan: MAC   Post-op Pain Management:    Induction: Intravenous  Airway Management Planned: Simple Face Mask  Additional Equipment:   Intra-op Plan:   Post-operative Plan:   Informed Consent: I have reviewed the patients History and Physical, chart, labs and discussed the procedure including the risks, benefits and alternatives for the proposed anesthesia with the patient or authorized representative who has indicated his/her understanding and acceptance.   Dental advisory given  Plan Discussed with: CRNA and Surgeon  Anesthesia Plan Comments:         Anesthesia Quick Evaluation

## 2011-02-10 NOTE — Brief Op Note (Signed)
02/10/2011  2:47 PM  PATIENT:  [name]  60 y.o. female  PRE-OPERATIVE DIAGNOSIS:  keloid sternum and right ear  POST-OPERATIVE DIAGNOSIS:  keloids sternum and right ear  PROCEDURE:  Procedure(s):1) Excision of recurrent Keloid Right Ear and 2) Excision of Sternal Keloids   PHYSICIAN:  Surgeon(s): Mary A Contogiannis  ANESTHESIA:   IV sedation  EBL:  Total I/O In: 900 [I.V.:900] Out: -   LOCAL MEDICATIONS USED:  MARCAINE 25CC  SPECIMEN:  Right Ear and Sternal Keloids  DISPOSITION OF SPECIMEN:  PATHOLOGY  COUNTS:  YES  DICTATION: 161096  PLAN OF CARE: Discharge to home after PACU  PATIENT DISPOSITION:  PACU - hemodynamically stable.   Delay start of Pharmacological VTE agent (>24hrs) due to surgical blood loss or risk of bleeding:  not applicable

## 2011-02-10 NOTE — Anesthesia Procedure Notes (Addendum)
Procedure Name: MAC Performed by: Radford Pax   Procedure Name: MAC Date/Time: 02/10/2011 1:15 PM Performed by: Radford Pax Pre-anesthesia Checklist: Patient identified, Timeout performed, Emergency Drugs available, Suction available and Patient being monitored Patient Re-evaluated:Patient Re-evaluated prior to inductionOxygen Delivery Method: Nasal Cannula Intubation Type: IV induction Placement Confirmation: positive ETCO2

## 2011-02-10 NOTE — H&P (Addendum)
60 yo old female presents with right earlobe keloid and sternal scar keloid and requests excision of both keloids.H&P updated. Paper copy on chart.

## 2011-02-10 NOTE — Progress Notes (Signed)
Nursing Note: Ancef dosage changed to 2 GM per weight based protocol

## 2011-02-10 NOTE — Anesthesia Postprocedure Evaluation (Signed)
  Anesthesia Post-op Note  Patient: [name]  Procedure(s) Performed:  EXCISION MASS - excision keloid sternum and right ear  Patient Location: PACU  Anesthesia Type: MAC  Level of Consciousness: awake, alert  and oriented  Airway and Oxygen Therapy: Patient Spontanous Breathing  Post-op Pain: mild  Post-op Assessment: Post-op Vital signs reviewed  Post-op Vital Signs: stable  Complications: No apparent anesthesia complications

## 2011-02-11 NOTE — Op Note (Signed)
[name], [name]              ACCOUNT NO.:  [id number]  MEDICAL RECORD NO.:  [id number]  LOCATION:                                 FACILITY:  PHYSICIAN:  Brantley Persons, M.D.DATE OF BIRTH:  01/11/1951  DATE OF PROCEDURE:  02/10/2011 DATE OF DISCHARGE:                              OPERATIVE REPORT   PREOPERATIVE DIAGNOSES: 1. Recurrent right ear keloid. 2. Sternal keloids status post median sternotomy.  POSTOPERATIVE DIAGNOSES: 1. Recurrent right ear keloid. 2. Sternal keloids status post median sternotomy.  PROCEDURES: 1. Complex closure 1.5 cm right posterior ear incision. 2. Wide local excision of 1.4 cm recurrent keloid of the right     posterior ear. 3. Complex closure 8.2 cm sternal incision. 4. Wide local excision of a 5.2 cm sternal keloid. 5. Wide local excision of 2.5 cm sternal keloid.  ATTENDING SURGEON:  Brantley Persons, MD  ANESTHESIA:  IV sedation.  ANESTHESIOLOGIST:  Sheldon Silvan, MD  FLUID REPLACEMENT:  900 mL crystalloid.  ESTIMATED BLOOD LOSS:  Minimal.  COMPLICATIONS:  None.  INDICATIONS FOR THE PROCEDURE:  The patient is a 60 year old Caucasian female who has a recurrent keloid in the right earlobe.  She notes that about 12 years ago she had an original keloid removed from the posterior ear lobe.  Within about 2 years ago, she began to wear earrings again in that earlobe and as a result, she has slowly developed a keloid that began developing about a year and a half ago.  It has slowly been increasing in size.  The keloid has begun on the posterior surface of the earlobe at the earring hole site and it does seem to go through the soft tissue of the earlobe but does not come out to the anterior surface.  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  However, she has developed 2 large areas of keloid in the scar. These areas have an area of normal scar  healing in between.  She would like for me to proceed with excision of both of these 2 areas of keloid upper median sternotomy scar.  PROCEDURE:  The patient was marked in preop holding area for the future keloid excision.  She was then taken back to the OR and placed on the table in supine position.  After adequate IV sedation was obtained, the right earlobe keloid as well as the base of the earlobe and the sternal incision were injected with 1% lidocaine with epinephrine.  Next, the patient was prepped with Techni-Care and draped in sterile fashion. Attention was first turned to the right ear keloid.  An incision was made at the base of the keloid at the limit of the normal ear skin as it approached the keloid skin.  The skin was sharply excised from the earlobe tissue.  The keloid did extend through the body of the earlobe but not out to the anterior surface.  Meticulous hemostasis was obtained.  Total length of keloid excision was 1.4 cm.  I then proceeded with a complex closure of the incision.  Deeper subcutaneous tissues were closed with 3-0 Monocryl suture.  The skin was then closed with 4-0 Prolene in a running mattress type suture.  Attention was then turned to the sternal keloids.  The incision was infiltrated with 0.5% Marcaine with epinephrine prior to the excision to help with pain control as well as to help with postoperative pain control.  An area measuring 5.2 x 1.2 cm along the superior aspect of the sternal incision that had keloided was sharply excised.  All keloid tissue was removed.  The skin edges were then undermined.  A complex closure of the incision then ensued. The deep subcutaneous tissues were closed using 2-0 Monocryl suture. The deep dermal layer was also closed with 3-0 Monocryl suture.  The skin was then closed with a 3-0 Monocryl in a running intracuticular stitch.  Attention was then turned to another area of the keloid scar more inferiorly.  A keloid  measuring 2.5 x 1 cm was sharply excised. All the keloid tissue was removed.  Meticulous hemostasis was obtained with the Bovie electrocautery.  The skin edges were then undermined for easier closure.  The incision was then closed in a complex fashion.  The deep subcutaneous tissues were closed with a 2-0 Monocryl suture.  The dermal layer was closed with 3-0 Monocryl suture.  The skin was then closed with a 3-0 Monocryl in a running intracuticular stitch.  Total length of the sternal keloid excisions was 5.2 cm for the upper scar keloid and 2.5 cm for the lower scar keloid.  The total length of complex closures for the sternal excisions were a total of 8.2 cm.  The external incision was dressed with benzoin and Steri-Strips, followed by 4x4s and Hypafix tape.  The earlobe keloid was dressed with bacitracin ointment.  There were no complications.  The patient tolerated the procedure well.  The final needle and sponge counts were reported to be correct at the end of the case.  The patient was then awakened from anesthesia and taken to recovery room in stable condition.  She was also recovered without complications. Both the patient and her family were given proper postoperative wound care instructions, and she was discharged home in their care in stable condition.  Followup appointment will be Thursday in the office.          ______________________________ Brantley Persons, M.D.     MC/MEDQ  D:  02/10/2011  T:  02/10/2011  Job:  [id number]

## 2011-02-16 ENCOUNTER — Other Ambulatory Visit: Payer: Self-pay | Admitting: *Deleted

## 2011-02-16 MED ORDER — GEMFIBROZIL 600 MG PO TABS: 600.0000 mg | ORAL_TABLET | Freq: Two times a day (BID) | ORAL | Status: DC

## 2011-02-16 MED ORDER — LEVOTHYROXINE SODIUM 137 MCG PO TABS: 137.0000 ug | ORAL_TABLET | Freq: Every day | ORAL | Status: DC

## 2011-02-19 ENCOUNTER — Encounter (HOSPITAL_BASED_OUTPATIENT_CLINIC_OR_DEPARTMENT_OTHER): Payer: Self-pay | Admitting: Plastic Surgery

## 2011-02-20 ENCOUNTER — Telehealth: Payer: Self-pay | Admitting: *Deleted

## 2011-02-20 DIAGNOSIS — E039 Hypothyroidism, unspecified: Secondary | ICD-10-CM

## 2011-02-24 ENCOUNTER — Other Ambulatory Visit: Payer: Self-pay | Admitting: Family Medicine

## 2011-02-24 MED ORDER — LEVOTHYROXINE SODIUM 175 MCG PO TABS: 175.0000 ug | ORAL_TABLET | Freq: Every day | ORAL | Status: DC

## 2011-03-12 DIAGNOSIS — I440 Atrioventricular block, first degree: Secondary | ICD-10-CM | POA: Insufficient documentation

## 2011-03-12 DIAGNOSIS — I422 Other hypertrophic cardiomyopathy: Secondary | ICD-10-CM | POA: Insufficient documentation

## 2011-03-12 DIAGNOSIS — I4891 Unspecified atrial fibrillation: Secondary | ICD-10-CM | POA: Insufficient documentation

## 2011-04-01 ENCOUNTER — Other Ambulatory Visit: Payer: Self-pay | Admitting: *Deleted

## 2011-04-01 MED ORDER — PANTOPRAZOLE SODIUM 40 MG PO TBEC: 40.0000 mg | DELAYED_RELEASE_TABLET | Freq: Every day | ORAL | Status: DC

## 2011-04-09 ENCOUNTER — Ambulatory Visit (INDEPENDENT_AMBULATORY_CARE_PROVIDER_SITE_OTHER): Payer: BC Managed Care – PPO | Admitting: Family Medicine

## 2011-04-09 VITALS — BP 134/71 | HR 59

## 2011-04-09 DIAGNOSIS — I4891 Unspecified atrial fibrillation: Secondary | ICD-10-CM

## 2011-04-09 LAB — POCT INR: INR: 1.7

## 2011-04-09 NOTE — Progress Notes (Signed)
  Subjective:    Patient ID: [name], female    DOB: Apr 19, 1950, 61 y.o.   MRN: [id number] PT/INR check HPI    Review of Systems     Objective:   Physical Exam        Assessment & Plan:

## 2011-04-16 ENCOUNTER — Ambulatory Visit (INDEPENDENT_AMBULATORY_CARE_PROVIDER_SITE_OTHER): Payer: BC Managed Care – PPO | Admitting: Family Medicine

## 2011-04-16 ENCOUNTER — Ambulatory Visit: Payer: BC Managed Care – PPO | Admitting: Family Medicine

## 2011-04-16 VITALS — BP 130/77 | HR 106

## 2011-04-16 DIAGNOSIS — I4891 Unspecified atrial fibrillation: Secondary | ICD-10-CM

## 2011-04-16 LAB — POCT INR: INR: 1.7

## 2011-04-16 NOTE — Progress Notes (Signed)
  Subjective:    Patient ID: [name], female    DOB: 1950-12-09, 61 y.o.   MRN: [id number] INR check HPI    Review of Systems     Objective:   Physical Exam        Assessment & Plan:

## 2011-04-23 ENCOUNTER — Ambulatory Visit: Payer: BC Managed Care – PPO

## 2011-05-04 ENCOUNTER — Ambulatory Visit: Payer: BC Managed Care – PPO

## 2011-05-06 ENCOUNTER — Other Ambulatory Visit: Payer: Self-pay | Admitting: Family Medicine

## 2011-05-12 ENCOUNTER — Other Ambulatory Visit: Payer: Self-pay | Admitting: *Deleted

## 2011-05-12 MED ORDER — LEVOTHYROXINE SODIUM 175 MCG PO TABS: 175.0000 ug | ORAL_TABLET | Freq: Every day | ORAL | Status: DC

## 2011-05-13 ENCOUNTER — Other Ambulatory Visit: Payer: Self-pay | Admitting: *Deleted

## 2011-05-13 MED ORDER — OMEGA-3-ACID ETHYL ESTERS 1 G PO CAPS: 2.0000 g | ORAL_CAPSULE | Freq: Two times a day (BID) | ORAL | Status: DC

## 2011-05-18 ENCOUNTER — Telehealth: Payer: Self-pay | Admitting: *Deleted

## 2011-05-18 DIAGNOSIS — R899 Unspecified abnormal finding in specimens from other organs, systems and tissues: Secondary | ICD-10-CM

## 2011-05-18 MED ORDER — LISINOPRIL-HYDROCHLOROTHIAZIDE 20-12.5 MG PO TABS: 1.0000 | ORAL_TABLET | Freq: Every day | ORAL | Status: DC

## 2011-05-18 NOTE — Telephone Encounter (Signed)
Ok to fill, not sure why not on med list unless was started in hospital.  I do want her to check a CMP and follow up w/ me in the next month.

## 2011-05-18 NOTE — Telephone Encounter (Signed)
Pt states that she has been taking Lisinopril/HCTZ (20-12.5) daily but I don't see it on her med list to refill for her. Please advise if this is ok to fill.

## 2011-05-20 LAB — COMPREHENSIVE METABOLIC PANEL
ALT: 19 U/L (ref 0–35)
AST: 29 U/L (ref 0–37)
Calcium: 9.2 mg/dL (ref 8.4–10.5)
Chloride: 100 mEq/L (ref 96–112)
Creat: 0.78 mg/dL (ref 0.50–1.10)

## 2011-06-08 ENCOUNTER — Other Ambulatory Visit: Payer: Self-pay | Admitting: *Deleted

## 2011-06-08 MED ORDER — ATENOLOL 25 MG PO TABS: 25.0000 mg | ORAL_TABLET | Freq: Every day | ORAL | Status: DC

## 2011-06-16 ENCOUNTER — Encounter: Payer: Self-pay | Admitting: Family Medicine

## 2011-06-16 ENCOUNTER — Ambulatory Visit
Admission: RE | Admit: 2011-06-16 | Discharge: 2011-06-16 | Disposition: A | Discharge status: Home / Self Care | Payer: BC Managed Care – PPO | Source: Ambulatory Visit | Attending: Family Medicine | Admitting: Family Medicine

## 2011-06-16 ENCOUNTER — Ambulatory Visit (INDEPENDENT_AMBULATORY_CARE_PROVIDER_SITE_OTHER): Payer: BC Managed Care – PPO | Admitting: Family Medicine

## 2011-06-16 VITALS — BP 106/63 | HR 87 | Wt 222.0 lb

## 2011-06-16 DIAGNOSIS — E039 Hypothyroidism, unspecified: Secondary | ICD-10-CM

## 2011-06-16 DIAGNOSIS — M25552 Pain in left hip: Secondary | ICD-10-CM

## 2011-06-16 DIAGNOSIS — R42 Dizziness and giddiness: Secondary | ICD-10-CM

## 2011-06-16 DIAGNOSIS — M25559 Pain in unspecified hip: Secondary | ICD-10-CM

## 2011-06-16 DIAGNOSIS — I10 Essential (primary) hypertension: Secondary | ICD-10-CM

## 2011-06-16 MED ORDER — OMEGA-3-ACID ETHYL ESTERS 1 G PO CAPS: 2.0000 g | ORAL_CAPSULE | Freq: Two times a day (BID) | ORAL | Status: DC

## 2011-06-16 MED ORDER — LISINOPRIL 10 MG PO TABS: 10.0000 mg | ORAL_TABLET | Freq: Every day | ORAL | Status: DC

## 2011-06-16 MED ORDER — LEVOTHYROXINE SODIUM 175 MCG PO TABS: 175.0000 ug | ORAL_TABLET | Freq: Every day | ORAL | Status: DC

## 2011-06-16 MED ORDER — PANTOPRAZOLE SODIUM 40 MG PO TBEC: 40.0000 mg | DELAYED_RELEASE_TABLET | Freq: Every day | ORAL | Status: DC

## 2011-06-16 MED ORDER — GEMFIBROZIL 600 MG PO TABS: 600.0000 mg | ORAL_TABLET | Freq: Two times a day (BID) | ORAL | Status: DC

## 2011-06-16 NOTE — Patient Instructions (Signed)
Please talk to your cardiologist about changing your atenolol to metoprolol for better cardiac protection.

## 2011-06-16 NOTE — Progress Notes (Signed)
Subjective:    Patient ID: [name], female    DOB: 08/15/1950, 61 y.o.   MRN: [id number]  HPI HTN - will get a few second of feeling lightheaded. Can happen up to 8 times a day and then will feel a rush of cold water into her chest. She has a fib but goes in and out.  No recent URI or vertigo sxs.  Says her lightheadedness feels like she is about to pass out. She has not lost consciousness. This has been going on for the last 2-3 months they've become much more frequent. She says she can tell that her heart was out of whack last week. It seems to be pacing 1 normally this week. She takes her medications regularly.  Left hip pain - Seems to catch when she walks.  Feels like a hitch in her hip. Says caused a limp. Started about a year ago but happneing more often. No old injuries. She has never had x-rays done. She is concerned about arthritis as she does have arthritis in other joints in her body. She denies any pain over the lateral portion of the hip.  Review of Systems  BP 106/63  Pulse 87  Wt 222 lb (100.699 kg)    No Known Allergies  Past Medical History  Diagnosis Date  . Hypertension   . Hypothyroidism   . GERD (gastroesophageal reflux disease)   . Hiatal hernia   . Dysrhythmia   . Parkinson's disease     Past Surgical History  Procedure Date  . Vaginal hysterectomy 1983  . Bil ctr 2004  . Rt shoulder 2012  . Total thyroidectomy 2007  . Inferior oblique myectomy   . Inferior oblique myectomy 2008    pt states it was her heart muscle  . Mass excision 02/10/2011    Procedure: EXCISION MASS;  Surgeon: Mary A Contogiannis;  Location: [facility];  Service: Plastics;  Laterality: N/A;  excision keloid sternum and right ear    History   Social History  . Marital Status: Married    Spouse Name: N/A    Number of Children: N/A  . Years of Education: N/A   Occupational History  . Not on file.   Social History Main Topics  . Smoking status: Never  Smoker   . Smokeless tobacco: Not on file  . Alcohol Use: Yes  . Drug Use: No  . Sexually Active: Yes    Birth Control/ Protection: Post-menopausal   Other Topics Concern  . Not on file   Social History Narrative  . No narrative on file    No family history on file.       Objective:   Physical Exam  Constitutional: She is oriented to person, place, and time. She appears well-developed and well-nourished.  HENT:  Head: Normocephalic and atraumatic.  Right Ear: External ear normal.  Left Ear: External ear normal.  Nose: Nose normal.  Mouth/Throat: Oropharynx is clear and moist.  Eyes: Conjunctivae are normal. Pupils are equal, round, and reactive to light.  Neck: Neck supple.  Cardiovascular: Normal rate and normal heart sounds.        Mostly regular rhythm.   Pulmonary/Chest: Effort normal and breath sounds normal.  Musculoskeletal:       Left hip is normal flexion, extension, internal and external rotation. She does have some pain with microscopy and hip outward. Strength is 5 out of 5. She is nontender over the greater trochanter.  Neurological: She is alert  and oriented to person, place, and time.  Skin: Skin is warm and dry.  Psychiatric: She has a normal mood and affect. Her behavior is normal.          Assessment & Plan:  Dizziness- we discussed this could be coming from her A. fib. She does have an appointment with her cardiologist tomorrow. Though I am most suspicious of her blood pressure. It is low today for her. Normally she runs in the 120s. She has lost a couple pounds which is fantastic. We will stop her HCTZ. She does not need this in combination with her Lasix which she takes daily. I will also decrease her lisinopril from 20 mg down to 10 mg. Also encouraged to speak with her cardiologist about changing her atenolol to metoprolol because it provides better cardiac protection. She does have an appointment with the cardiologist tomorrow.  HTN -Low today.  See above.    Left hip pain I am concerned about osteoarthritis. I do not think she has trochanteric bursitis. We will get an x-ray today. If she has significant arthritis then consider referral to an orthopedist for further evaluation and possible injection. Also consider she could have something to the cartilage that causing the locking sensation.

## 2011-06-17 ENCOUNTER — Other Ambulatory Visit: Payer: Self-pay | Admitting: Family Medicine

## 2011-06-17 DIAGNOSIS — M25559 Pain in unspecified hip: Secondary | ICD-10-CM

## 2011-06-17 LAB — COMPLETE METABOLIC PANEL WITH GFR
ALT: 21 U/L (ref 0–35)
BUN: 25 mg/dL — ABNORMAL HIGH (ref 6–23)
CO2: 28 mEq/L (ref 19–32)
Creat: 1.09 mg/dL (ref 0.50–1.10)
GFR, Est African American: 63 mL/min
GFR, Est Non African American: 55 mL/min — ABNORMAL LOW
Glucose, Bld: 97 mg/dL (ref 70–99)
Total Bilirubin: 0.3 mg/dL (ref 0.3–1.2)

## 2011-06-17 LAB — TSH: TSH: 1.066 u[IU]/mL (ref 0.350–4.500)

## 2011-06-18 ENCOUNTER — Encounter: Payer: Self-pay | Admitting: Family Medicine

## 2011-06-18 DIAGNOSIS — H353190 Nonexudative age-related macular degeneration, unspecified eye, stage unspecified: Secondary | ICD-10-CM | POA: Insufficient documentation

## 2011-06-18 DIAGNOSIS — H43813 Vitreous degeneration, bilateral: Secondary | ICD-10-CM | POA: Insufficient documentation

## 2011-07-02 ENCOUNTER — Telehealth: Payer: Self-pay | Admitting: Family Medicine

## 2011-07-02 NOTE — Telephone Encounter (Signed)
Patient called request to have lab order for blood work sent to lab. Patient said she will go next week sometime.Marland Kitchen

## 2011-07-06 ENCOUNTER — Telehealth: Payer: Self-pay | Admitting: *Deleted

## 2011-07-06 DIAGNOSIS — M869 Osteomyelitis, unspecified: Secondary | ICD-10-CM

## 2011-07-06 NOTE — Telephone Encounter (Signed)
Labs

## 2011-07-07 LAB — BASIC METABOLIC PANEL WITH GFR
GFR, Est African American: 69 mL/min
GFR, Est Non African American: 60 mL/min
Potassium: 3.5 mEq/L (ref 3.5–5.3)
Sodium: 140 mEq/L (ref 135–145)

## 2011-07-07 NOTE — Telephone Encounter (Signed)
Called pt and advised her needs to have an A1C done with nurse here uin the office

## 2011-09-16 ENCOUNTER — Other Ambulatory Visit: Payer: Self-pay | Admitting: *Deleted

## 2011-09-16 ENCOUNTER — Other Ambulatory Visit: Payer: Self-pay | Admitting: Family Medicine

## 2011-09-16 MED ORDER — LISINOPRIL 10 MG PO TABS: 10.0000 mg | ORAL_TABLET | Freq: Every day | ORAL | Status: DC

## 2011-10-12 ENCOUNTER — Other Ambulatory Visit: Payer: Self-pay | Admitting: Family Medicine

## 2011-10-12 MED ORDER — FUROSEMIDE 40 MG PO TABS: 40.0000 mg | ORAL_TABLET | Freq: Every day | ORAL | Status: DC

## 2011-10-12 MED ORDER — RASAGILINE MESYLATE 1 MG PO TABS: 1.0000 mg | ORAL_TABLET | Freq: Every day | ORAL | Status: AC

## 2011-10-16 ENCOUNTER — Other Ambulatory Visit: Payer: Self-pay | Admitting: *Deleted

## 2011-10-16 MED ORDER — GEMFIBROZIL 600 MG PO TABS: 600.0000 mg | ORAL_TABLET | Freq: Two times a day (BID) | ORAL | Status: DC

## 2011-10-16 MED ORDER — LEVOTHYROXINE SODIUM 125 MCG PO TABS: 125.0000 ug | ORAL_TABLET | Freq: Every day | ORAL | Status: DC

## 2011-12-10 LAB — TSH: TSH: 9.8

## 2011-12-10 LAB — LIPID PANEL
Cholesterol, Total: 246
LDL (calc): 140
Triglyceride fasting, serum: 362

## 2011-12-10 LAB — CBC
Hemoglobin: 11.9 g/dL — AB (ref 12.0–16.0)
Platelets: 421
WBC: 8.4

## 2011-12-11 LAB — HEMOGLOBIN A1C: A1c: 7

## 2011-12-13 ENCOUNTER — Telehealth: Payer: Self-pay | Admitting: Family Medicine

## 2011-12-13 NOTE — Telephone Encounter (Signed)
Call pt: Got labs from [facility]. They asked me to address them with her. Have her make appt this week with me.

## 2011-12-14 NOTE — Telephone Encounter (Signed)
LMOM for pt letting her know that we have received lab results and she needs to schedule appt with Dr. Linford Arnold to go over results.

## 2011-12-18 ENCOUNTER — Encounter: Payer: Self-pay | Admitting: Family Medicine

## 2011-12-18 ENCOUNTER — Ambulatory Visit (INDEPENDENT_AMBULATORY_CARE_PROVIDER_SITE_OTHER): Payer: BC Managed Care – PPO | Admitting: Family Medicine

## 2011-12-18 VITALS — BP 127/80 | HR 74 | Ht 63.0 in | Wt 213.0 lb

## 2011-12-18 DIAGNOSIS — E039 Hypothyroidism, unspecified: Secondary | ICD-10-CM

## 2011-12-18 DIAGNOSIS — R799 Abnormal finding of blood chemistry, unspecified: Secondary | ICD-10-CM

## 2011-12-18 DIAGNOSIS — IMO0001 Reserved for inherently not codable concepts without codable children: Secondary | ICD-10-CM | POA: Insufficient documentation

## 2011-12-18 DIAGNOSIS — R7989 Other specified abnormal findings of blood chemistry: Secondary | ICD-10-CM

## 2011-12-18 DIAGNOSIS — D473 Essential (hemorrhagic) thrombocythemia: Secondary | ICD-10-CM

## 2011-12-18 DIAGNOSIS — E785 Hyperlipidemia, unspecified: Secondary | ICD-10-CM

## 2011-12-18 MED ORDER — GEMFIBROZIL 600 MG PO TABS: 600.0000 mg | ORAL_TABLET | Freq: Two times a day (BID) | ORAL | Status: DC

## 2011-12-18 MED ORDER — LISINOPRIL 10 MG PO TABS: 10.0000 mg | ORAL_TABLET | Freq: Every day | ORAL | Status: AC

## 2011-12-18 MED ORDER — PRAVASTATIN SODIUM 20 MG PO TABS: 20.0000 mg | ORAL_TABLET | Freq: Every day | ORAL | Status: AC

## 2011-12-18 MED ORDER — OMEGA-3-ACID ETHYL ESTERS 1 G PO CAPS: 2.0000 g | ORAL_CAPSULE | Freq: Two times a day (BID) | ORAL | Status: AC

## 2011-12-18 MED ORDER — AMITRIPTYLINE HCL 25 MG PO TABS: 25.0000 mg | ORAL_TABLET | Freq: Every day | ORAL | Status: AC

## 2011-12-18 MED ORDER — ATENOLOL 25 MG PO TABS
25.0000 mg | ORAL_TABLET | Freq: Every day | ORAL | Status: AC
Start: 2011-12-18 — End: ?

## 2011-12-18 MED ORDER — PANTOPRAZOLE SODIUM 40 MG PO TBEC: 40.0000 mg | DELAYED_RELEASE_TABLET | Freq: Every day | ORAL | Status: AC

## 2011-12-18 MED ORDER — FUROSEMIDE 40 MG PO TABS: 40.0000 mg | ORAL_TABLET | Freq: Every day | ORAL | Status: AC

## 2011-12-18 NOTE — Addendum Note (Signed)
Addended by: Nani Gasser D on: 12/18/2011 09:48 PM   Modules accepted: Level of Service

## 2011-12-18 NOTE — Progress Notes (Addendum)
  Subjective:    Patient ID: [name], female    DOB: Dec 23, 1950, 60 y.o.   MRN: [id number]  HPI Here to f/u labs from [facility]. A1C was elevated .  She says she was told to followup but was not told about the actual lab results and what they mean. She also had some recent blood work performed at work about a copy of that report in as well today. She had some questions about her abnormal thyroid level on there. She reports she is taking her medication regularly, an hour before breakfast on an empty stomach. She is not exercising bc she feels very fatigued.  Says has been gaining weight. Had cardiac ablation this summer.  But has really felt better energy wise since then. No polyuria or polydipsia.   Review of Systems     Objective:   Physical Exam  Constitutional: She is oriented to person, place, and time. She appears well-developed and well-nourished.  HENT:  Head: Normocephalic and atraumatic.  Cardiovascular: Normal rate, regular rhythm and normal heart sounds.   Pulmonary/Chest: Effort normal and breath sounds normal.  Neurological: She is alert and oriented to person, place, and time.  Skin: Skin is warm and dry.  Psychiatric: She has a normal mood and affect. Her behavior is normal.          Assessment & Plan:  Hypothyroid - she brought in a copy of lab work that was done through her work. As performed on September 5. Her TSH was elevated at 9. I look back and we had done a TSH back in April which look fantastic at 1.0. She says she is taking her medications consistently about hour before breakfast daily. We will recheck her TSH to confirm. If it is elevated this may explain some of her recent fatigue and weight gain. If we can correct this and this will hopefully help with her motivation to exercise.  DM-  new diagnosis. Uncontrolled. We discussed different options including starting a medication versus working on diet and exercise for the next 3 months. She opts to work  on diet and exercise. I offered to write a prescription for glucometer. She says her husband has one and she would prefer to just use his for now. I asked her to check her sugars at least 2-3 times per week and bring in a record of this at her followup in 6 weeks. At that time we can get her caught up as far as her monofilament exam and urine microalbumin. I did not want to overwhelm her today. I also will schedule her for diabetic and nutrition counseling. I also recommended she go to the American diabetic Association website, www. A.diabetes.org.  Her alkaline phosphatase was also elevated on the blood work. Her AST and ALT were normal. We'll recheck this today as well.  Her platelets were also elevated-we will recheck this today as well. We discussed that this can be a sign of inflammation or she could have been mildly dehydrated when she had the blood work draw.  Hyperlipidemia - not at goal. Start startin Discussed potential SE. REcheck level at f/u in 6 weeks.  Lab Results  Component Value Date   HDL 34* 12/10/2011   LDLCALC 140 12/10/2011

## 2011-12-19 LAB — CBC WITH DIFFERENTIAL/PLATELET
Eosinophils Absolute: 0.2 10*3/uL (ref 0.0–0.7)
Eosinophils Relative: 2 % (ref 0–5)
Hemoglobin: 11.9 g/dL — ABNORMAL LOW (ref 12.0–15.0)
Lymphocytes Relative: 39 % (ref 12–46)
Lymphs Abs: 3 10*3/uL (ref 0.7–4.0)
MCH: 26.9 pg (ref 26.0–34.0)
MCV: 81 fL (ref 78.0–100.0)
Monocytes Relative: 8 % (ref 3–12)
Platelets: 362 10*3/uL (ref 150–400)
RBC: 4.43 MIL/uL (ref 3.87–5.11)
WBC: 7.8 10*3/uL (ref 4.0–10.5)

## 2011-12-19 LAB — TSH: TSH: 6.419 u[IU]/mL — ABNORMAL HIGH (ref 0.350–4.500)

## 2011-12-21 ENCOUNTER — Other Ambulatory Visit: Payer: Self-pay | Admitting: Family Medicine

## 2011-12-21 MED ORDER — LEVOTHYROXINE SODIUM 200 MCG PO TABS: 200.0000 ug | ORAL_TABLET | Freq: Every day | ORAL | Status: DC

## 2012-01-20 ENCOUNTER — Other Ambulatory Visit: Payer: Self-pay | Admitting: Family Medicine

## 2012-01-29 ENCOUNTER — Ambulatory Visit: Payer: BC Managed Care – PPO | Admitting: Family Medicine

## 2012-01-29 DIAGNOSIS — Z0289 Encounter for other administrative examinations: Secondary | ICD-10-CM

## 2012-02-17 ENCOUNTER — Other Ambulatory Visit: Payer: Self-pay | Admitting: Family Medicine

## 2012-02-18 ENCOUNTER — Other Ambulatory Visit: Payer: Self-pay | Admitting: *Deleted

## 2012-02-25 ENCOUNTER — Other Ambulatory Visit: Payer: Self-pay | Admitting: Family Medicine

## 2012-02-25 DIAGNOSIS — Z139 Encounter for screening, unspecified: Secondary | ICD-10-CM

## 2012-03-01 ENCOUNTER — Ambulatory Visit: Payer: BC Managed Care – PPO

## 2012-03-08 ENCOUNTER — Ambulatory Visit (INDEPENDENT_AMBULATORY_CARE_PROVIDER_SITE_OTHER): Payer: BC Managed Care – PPO

## 2012-03-08 DIAGNOSIS — Z1231 Encounter for screening mammogram for malignant neoplasm of breast: Secondary | ICD-10-CM

## 2012-04-16 ENCOUNTER — Other Ambulatory Visit: Payer: Self-pay | Admitting: Family Medicine

## 2012-06-17 ENCOUNTER — Encounter: Payer: Self-pay | Admitting: Family Medicine

## 2012-06-17 DIAGNOSIS — M5416 Radiculopathy, lumbar region: Secondary | ICD-10-CM

## 2012-06-17 DIAGNOSIS — M5412 Radiculopathy, cervical region: Secondary | ICD-10-CM | POA: Insufficient documentation

## 2012-06-17 DIAGNOSIS — G609 Hereditary and idiopathic neuropathy, unspecified: Secondary | ICD-10-CM | POA: Insufficient documentation

## 2012-10-13 ENCOUNTER — Other Ambulatory Visit: Payer: Self-pay

## 2013-02-17 ENCOUNTER — Other Ambulatory Visit: Payer: Self-pay | Admitting: Family Medicine

## 2013-03-08 ENCOUNTER — Other Ambulatory Visit: Payer: Self-pay | Admitting: *Deleted

## 2013-03-08 MED ORDER — GEMFIBROZIL 600 MG PO TABS: 600.0000 mg | ORAL_TABLET | Freq: Two times a day (BID) | ORAL | Status: AC

## 2020-05-18 ENCOUNTER — Emergency Department (INDEPENDENT_AMBULATORY_CARE_PROVIDER_SITE_OTHER)
Admission: EM | Admit: 2020-05-18 | Discharge: 2020-05-18 | Disposition: A | Discharge status: Home / Self Care | Payer: Medicare Other | Source: Home / Self Care

## 2020-05-18 ENCOUNTER — Other Ambulatory Visit: Payer: Self-pay

## 2020-05-18 DIAGNOSIS — R404 Transient alteration of awareness: Secondary | ICD-10-CM | POA: Diagnosis not present

## 2020-05-18 DIAGNOSIS — I4891 Unspecified atrial fibrillation: Secondary | ICD-10-CM

## 2020-05-18 DIAGNOSIS — I959 Hypotension, unspecified: Secondary | ICD-10-CM

## 2020-05-18 DIAGNOSIS — R309 Painful micturition, unspecified: Secondary | ICD-10-CM

## 2020-05-18 DIAGNOSIS — R300 Dysuria: Secondary | ICD-10-CM | POA: Diagnosis not present

## 2020-05-18 HISTORY — DX: Parkinson's disease without dyskinesia, without mention of fluctuations: G20.A1

## 2020-05-18 HISTORY — DX: Parkinson's disease: G20

## 2020-05-18 LAB — POCT URINALYSIS DIP (MANUAL ENTRY)
Bilirubin, UA: NEGATIVE
Glucose, UA: NEGATIVE mg/dL
Ketones, POC UA: NEGATIVE mg/dL
Nitrite, UA: POSITIVE — AB
Protein Ur, POC: NEGATIVE mg/dL
Spec Grav, UA: 1.025 (ref 1.010–1.025)
Urobilinogen, UA: 1 E.U./dL
pH, UA: 6 (ref 5.0–8.0)

## 2020-05-18 NOTE — ED Provider Notes (Signed)
[facility]    CSN: [id number] Arrival date & time: 05/18/20  1124      History   Chief Complaint Chief Complaint  Patient presents with  . Hallucinations    HPI [name] is a 70 y.o. female.   HPI Patient presents today accompanied by her daughter who reports patient has had a few episodes of confusion and hallucinations during the week this week.  Patient has a complicated medical history including atrial fibrillation, cardiomyopathy, and has recently had some episodes of syncope within the last 6 months.  In review of EMR last cardiology note cardiologist was concerned that patient may be having intermittent dysrhythmias which may be the result of her having syncopal episodes.  And he was discussing possible implantation of an ICD.  Patient recently was taken off of her amiodarone approximately 2 weeks due to abnormal pulmonary function testing.  The patient denies experiencing any pain.  She does receive home physical therapy and daughter reports that her blood pressure was checked during physical therapy this week and she was not made aware of any concerning readings.  On arrival today patient's blood pressure is 97/61 and on recheck it was 82/62.  Patient continues to eat and drink as normal.  Daughter was concerned about a possible UTI given the confusion however patient denies any dysuria and hydrates well at home with water.  Daughter has been given her Cystex which is an over-the-counter analgesic for cystitis symptoms.  She took her last dose today. Past Medical History:  Diagnosis Date  . Dysrhythmia   . GERD (gastroesophageal reflux disease)   . Hiatal hernia   . Hypertension   . Hypothyroidism   . Parkinson disease ([place])   . Parkinson's disease     Patient Active Problem List   Diagnosis Date Noted  . Cervical radiculopathy 06/17/2012  . Lumbar radiculopathy 06/17/2012  . Unspecified hereditary and idiopathic peripheral neuropathy 06/17/2012  .  Type II or unspecified type diabetes mellitus without mention of complication, uncontrolled 12/18/2011  . Macular degeneration, dry 06/18/2011  . Posterior vitreous detachment of both eyes 06/18/2011  . Atrial fibrillation ([place]) 03/12/2011  . 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 AV block, 1st degree 03/12/2011  . Paroxysmal atrial fibrillation ([place]) 10/07/2010  . SHOULDER PAIN 03/25/2010  . OBESITY, UNSPECIFIED 08/21/2009  . POLYARTHRITIS 04/11/2009  . HYPOTHYROIDISM 01/09/2009  . HYPERLIPIDEMIA 01/09/2009  . PARKINSON'S DISEASE 01/09/2009  . ESSENTIAL HYPERTENSION, BENIGN 01/09/2009    Past Surgical History:  Procedure Laterality Date  . bil ctr  2004  . INFERIOR OBLIQUE MYECTOMY    . INFERIOR OBLIQUE MYECTOMY  2008   pt states it was her heart muscle  . MASS EXCISION  02/10/2011   Procedure: EXCISION MASS;  Surgeon: Mary A Contogiannis;  Location: [place];  Service: Plastics;  Laterality: N/A;  excision keloid sternum and right ear  . rt shoulder  2012  . TOTAL THYROIDECTOMY  2007  . VAGINAL HYSTERECTOMY  1983    OB History   No obstetric history on file.      Home Medications    Prior to Admission medications   Medication Sig Start Date End Date Taking? Authorizing Provider  AMANTADINE HCL PO Take 100 mg by mouth.    [provider]  amitriptyline (ELAVIL) 25 MG tablet Take 1 tablet (25 mg total) by mouth at bedtime. 12/18/11   Hali Marry, MD  atenolol (TENORMIN) 25 MG tablet Take 1  tablet (25 mg total) by mouth daily. 12/18/11   Hali Marry, MD  Cholecalciferol (VITAMIN D3) 2000 UNITS TABS Take by mouth.    [provider]  dabigatran (PRADAXA) 150 MG CAPS Take 150 mg by mouth 2 (two) times daily.    [provider]  furosemide (LASIX) 40 MG tablet Take 1 tablet (40 mg total) by mouth daily. 12/18/11   Hali Marry, MD  gemfibrozil (LOPID) 600 MG tablet Take 1 tablet  (600 mg total) by mouth 2 (two) times daily. 03/08/13   Hali Marry, MD  levothyroxine (SYNTHROID, LEVOTHROID) 200 MCG tablet TAKE 1 TABLET (200 MCG TOTAL) BY MOUTH DAILY. 01/20/12   Hali Marry, MD  levothyroxine (SYNTHROID, LEVOTHROID) 200 MCG tablet TAKE 1 TABLET (200 MCG TOTAL) BY MOUTH DAILY. 02/17/12   Hali Marry, MD  levothyroxine (SYNTHROID, LEVOTHROID) 200 MCG tablet TAKE 1 TABLET (200 MCG TOTAL) BY MOUTH DAILY. 02/17/12   Hali Marry, MD  levothyroxine (SYNTHROID, LEVOTHROID) 200 MCG tablet TAKE 1 TABLET (200 MCG TOTAL) BY MOUTH DAILY. 04/16/12   Hali Marry, MD  lisinopril (PRINIVIL,ZESTRIL) 10 MG tablet Take 1 tablet (10 mg total) by mouth daily. 12/18/11   Hali Marry, MD  MAGNESIUM PO Take by mouth.    [provider]  omega-3 acid ethyl esters (LOVAZA) 1 G capsule Take 2 capsules (2 g total) by mouth 2 (two) times daily. 12/18/11 12/17/12  Hali Marry, MD  pantoprazole (PROTONIX) 40 MG tablet Take 1 tablet (40 mg total) by mouth daily. 12/18/11   Hali Marry, MD  pravastatin (PRAVACHOL) 20 MG tablet Take 1 tablet (20 mg total) by mouth daily. 12/18/11 12/17/12  Hali Marry, MD  rasagiline (AZILECT) 1 MG TABS Take 1 tablet (1 mg total) by mouth daily. 10/12/11   Hali Marry, MD  rOPINIRole (REQUIP) 3 MG tablet Take 3 mg by mouth 2 (two) times daily.     [provider]  rotigotine (NEUPRO) 2 MG/24HR Place 1 patch onto the skin daily.     [provider]    Family History History reviewed. No pertinent family history.  Social History Social History   Tobacco Use  . Smoking status: Never Smoker  . Smokeless tobacco: Never Used  Substance Use Topics  . Alcohol use: Yes  . Drug use: No     Allergies   Patient has no known allergies.   Review of Systems Review of Systems Pertinent negatives listed in HPI   Physical Exam Triage Vital Signs ED Triage  Vitals  Enc Vitals Group     BP 05/18/20 1144 97/61     Pulse Rate 05/18/20 1144 73     Resp --      Temp 05/18/20 1144 98.7 F (37.1 C)     Temp Source 05/18/20 1144 Oral     SpO2 05/18/20 1144 95 %     Weight 05/18/20 1141 174 lb (78.9 kg)     Height 05/18/20 1141 5\' 3"  (1.6 m)     Head Circumference --      Peak Flow --      Pain Score 05/18/20 1141 3     Pain Loc --      Pain Edu? --      Excl. in [place]? --    No data found.  Updated Vital Signs BP 97/61 (BP Location: Right Arm)   Pulse 73   Temp 98.7 F (37.1 C) (Oral)  Ht 5\' 3"  (1.6 m)   Wt 174 lb (78.9 kg)   SpO2 95%   BMI 30.82 kg/m   Visual Acuity Right Eye Distance:   Left Eye Distance:   Bilateral Distance:    Right Eye Near:   Left Eye Near:    Bilateral Near:     Physical Exam Constitutional:      Appearance: She is ill-appearing.     Comments: Patient is chronically ill appearing, generalized tremors (related to Parkinson's)  Cardiovascular:     Rate and Rhythm: Normal rate. Rhythm irregularly irregular.     Heart sounds: Heart sounds not distant. No murmur heard.     Comments: Trace bilateral upper extremity lower forearm swelling No appreciable edema of the lower extremities Pulmonary:     Effort: Pulmonary effort is normal. No accessory muscle usage.     Breath sounds: Normal air entry. Decreased breath sounds and rhonchi present. No wheezing.  Skin:    General: Skin is warm and dry.     Coloration: Skin is pale.  Neurological:     Mental Status: She is alert.     Motor: Weakness present.     Comments: Patient is in wheelchair Visible tremors generalized    Psychiatric:        Attention and Perception: Attention normal.        Mood and Affect: Mood normal.        Speech: Speech normal.       Exam limited due to patient is in a wheelchair  UC Treatments / Results  Labs (all labs ordered are listed, but only abnormal results are displayed) Labs Reviewed  URINE CULTURE  POCT  URINALYSIS DIP (MANUAL ENTRY)    EKG Atrial Fibrillation, ventricular rate 77, LBBB   Radiology No results found.  Procedures Procedures (including critical care time)  Medications Ordered in UC Medications - No data to display  Initial Impression / Assessment and Plan / UC Course  I have reviewed the triage vital signs and the nursing notes.  Pertinent labs & imaging results that were available during my care of the patient were reviewed by me and considered in my medical decision making (see chart for details).    Patient presents today accompanied by her daughter who is concerned for recurrent episodes of altered mental status over the span of this week. Daughter had medicated patient prior to arrival with Cystex which is similar to Azo in his analgesic for dysuria however patient denies any pain with urination.  Urine was collected today which showed a only a trace of bacteria, trace intact blood, and noted to be nitrates nitrates positive for results with dipstick analysis- Results have a high yield of unreliability following use of analgesics for dysuria. Patient hypotensive on arrival, repeat with manual BP cuff indicated a reading 82/62. Of note, patient was previously prescribed Amiodarone and this medication was discontinued around 04/26/2020 as patient had abnormal pulmonary functioning test indicating a reduction in diffusing capacity therefore medication was discontinued. Given limitations in Urgent Care setting patient is being transported to [facility] by daughter for further work-up and evaluation of abnormal BP. EKG continues to reveal Atrial fib with LBBB (chronic known diagnosis)- unable to ascertain if patient is always in Afib. Prior EMR notes paroxsymal atrial fibrillation. Final Clinical Impressions(s) / UC Diagnoses   Final diagnoses:  Transient alteration of awareness  Atrial fibrillation, unspecified type (HCC)  Hypotension, unspecified hypotension  type     Discharge Instructions  Given recent discontinuation of amiodarone and patient's low blood pressure here in clinic and abnormal EKG reading I recommend patient be seen in the emergency department for further work-up and evaluation of atrial fibrillation, low blood pressure reading, and recent home episode of altered mental status.      ED Prescriptions    None     PDMP not reviewed this encounter.   Scot Jun, FNP 05/18/20 1324

## 2020-05-18 NOTE — ED Notes (Signed)
Pt left with daughter ambulating to the ER

## 2020-05-18 NOTE — ED Triage Notes (Signed)
x5 days. pts daughter says that pt has been having some hallucinations and she believes she has a uti. Pt states that she does have some pain with urination.

## 2020-05-18 NOTE — Discharge Instructions (Signed)
Given recent discontinuation of amiodarone and patient's low blood pressure here in clinic and abnormal EKG reading I recommend patient be seen in the emergency department for further work-up and evaluation of atrial fibrillation, low blood pressure reading, and recent home episode of altered mental status.

## 2020-05-21 LAB — URINE CULTURE
MICRO NUMBER:: 11530568
SPECIMEN QUALITY:: ADEQUATE
